# Patient Record
Sex: Female | Born: 1985
Health system: Southern US, Community
[De-identification: ages and names within clinical notes are randomized; demographics above are authoritative.]

## PROBLEM LIST (undated history)

## (undated) DIAGNOSIS — Z8719 Personal history of other diseases of the digestive system: Secondary | ICD-10-CM

## (undated) DIAGNOSIS — G43909 Migraine, unspecified, not intractable, without status migrainosus: Secondary | ICD-10-CM

## (undated) DIAGNOSIS — G5600 Carpal tunnel syndrome, unspecified upper limb: Secondary | ICD-10-CM

## (undated) DIAGNOSIS — R739 Hyperglycemia, unspecified: Secondary | ICD-10-CM

## (undated) DIAGNOSIS — Q134 Other congenital corneal malformations: Secondary | ICD-10-CM

## (undated) DIAGNOSIS — F329 Major depressive disorder, single episode, unspecified: Secondary | ICD-10-CM

## (undated) DIAGNOSIS — N926 Irregular menstruation, unspecified: Secondary | ICD-10-CM

## (undated) DIAGNOSIS — I10 Essential (primary) hypertension: Secondary | ICD-10-CM

## (undated) DIAGNOSIS — F32A Depression, unspecified: Secondary | ICD-10-CM

## (undated) DIAGNOSIS — E559 Vitamin D deficiency, unspecified: Secondary | ICD-10-CM

## (undated) DIAGNOSIS — R5382 Chronic fatigue, unspecified: Secondary | ICD-10-CM

## (undated) DIAGNOSIS — N920 Excessive and frequent menstruation with regular cycle: Secondary | ICD-10-CM

## (undated) DIAGNOSIS — G47419 Narcolepsy without cataplexy: Secondary | ICD-10-CM

## (undated) HISTORY — DX: Narcolepsy without cataplexy: G47.419

## (undated) HISTORY — DX: Personal history of other diseases of the digestive system: Z87.19

## (undated) HISTORY — DX: Vitamin D deficiency, unspecified: E55.9

## (undated) HISTORY — DX: Migraine, unspecified, not intractable, without status migrainosus: G43.909

## (undated) HISTORY — DX: Hyperglycemia, unspecified: R73.9

## (undated) HISTORY — DX: Excessive and frequent menstruation with regular cycle: N92.0

## (undated) HISTORY — DX: Depression, unspecified: F32.A

## (undated) HISTORY — DX: Irregular menstruation, unspecified: N92.6

## (undated) HISTORY — DX: Carpal tunnel syndrome, unspecified upper limb: G56.00

## (undated) HISTORY — DX: Major depressive disorder, single episode, unspecified: F32.9

## (undated) HISTORY — DX: Other congenital corneal malformations: Q13.4

## (undated) HISTORY — DX: Chronic fatigue, unspecified: R53.82

---
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
## 2010-02-14 HISTORY — PX: TUBAL LIGATION: SHX77

## 2011-02-15 DIAGNOSIS — Z8719 Personal history of other diseases of the digestive system: Secondary | ICD-10-CM

## 2011-02-15 HISTORY — DX: Personal history of other diseases of the digestive system: Z87.19

## 2011-03-29 ENCOUNTER — Emergency Department (HOSPITAL_COMMUNITY): Payer: Medicaid Other

## 2011-03-29 ENCOUNTER — Emergency Department (HOSPITAL_COMMUNITY)
Admission: EM | Admit: 2011-03-29 | Discharge: 2011-03-29 | Disposition: A | Discharge status: Home / Self Care | Payer: Medicaid Other | Attending: Emergency Medicine | Admitting: Emergency Medicine

## 2011-03-29 ENCOUNTER — Encounter (HOSPITAL_COMMUNITY): Payer: Self-pay

## 2011-03-29 DIAGNOSIS — M79671 Pain in right foot: Secondary | ICD-10-CM

## 2011-03-29 DIAGNOSIS — W208XXA Other cause of strike by thrown, projected or falling object, initial encounter: Secondary | ICD-10-CM | POA: Insufficient documentation

## 2011-03-29 DIAGNOSIS — F17200 Nicotine dependence, unspecified, uncomplicated: Secondary | ICD-10-CM | POA: Insufficient documentation

## 2011-03-29 DIAGNOSIS — M79609 Pain in unspecified limb: Secondary | ICD-10-CM | POA: Insufficient documentation

## 2011-03-29 MED ORDER — OXYCODONE-ACETAMINOPHEN 5-325 MG PO TABS: 2.0000 | ORAL_TABLET | ORAL | Status: AC | PRN

## 2011-03-29 MED ORDER — OXYCODONE-ACETAMINOPHEN 5-325 MG PO TABS
1.0000 | ORAL_TABLET | Freq: Once | ORAL | Status: AC
  Administered 2011-03-29: 1 via ORAL
  Filled 2011-03-29: qty 1

## 2011-03-29 NOTE — Progress Notes (Signed)
Orthopedic Tech Progress Note Patient Details:  Michelle Newgent 1985/12/08 [id number]  Other Ortho Devices Type of Ortho Device: Postop boot Ortho Device Location: right foot.post op boot Ortho Device Interventions: Application   Gaye Pollack 03/29/2011, 1:07 PM

## 2011-03-29 NOTE — ED Provider Notes (Signed)
History     CSN: [id number]  Arrival date & time 03/29/11  1016   First MD Initiated Contact with Patient 03/29/11 1110      Chief Complaint  Patient presents with  . Foot Injury    (Consider location/radiation/quality/duration/timing/severity/associated sxs/prior treatment) Patient is a 26 y.o. female presenting with foot injury. The history is provided by the patient.  Foot Injury    The patient is a 26 year old, female, with no significant past medical history.  She presents to the emergency department complaining of right foot pain since yesterday.  She dropped a box on it.  Yesterday.  She says the pain was so severe last night.  That she was unable to sleep.  She has no other other injuries or complaints.  She denies allergies to medications. History reviewed. No pertinent past medical history.  History reviewed. No pertinent past surgical history.  History reviewed. No pertinent family history.  History  Substance Use Topics  . Smoking status: Current Everyday Smoker  . Smokeless tobacco: Not on file  . Alcohol Use: No    OB History    Grav Para Term Preterm Abortions TAB SAB Ect Mult Living                  Review of Systems  Musculoskeletal:       Right foot pain  Neurological: Negative for weakness.  Hematological: Does not bruise/bleed easily.    Allergies  Review of patient's allergies indicates no known allergies.  Home Medications   Current Outpatient Rx  Name Route Sig Dispense Refill  . ERGOCALCIFEROL 50000 UNITS PO CAPS Oral Take 50,000 Units by mouth once a week. Takes on Fridays      BP 121/63  Pulse 78  Temp(Src) 98 F (36.7 C) (Oral)  Resp 20  SpO2 96%  LMP 02/27/2011  Physical Exam  Vitals reviewed. Constitutional: She is oriented to person, place, and time. She appears well-developed and well-nourished.  HENT:  Head: Normocephalic and atraumatic.  Eyes: Pupils are equal, round, and reactive to light.  Neck: Normal range of  motion.  Pulmonary/Chest: Effort normal. No respiratory distress.  Musculoskeletal: Normal range of motion. She exhibits tenderness. She exhibits no edema.       Right foot No deformities.  No ecchymoses.  No lacerations to moderate tenderness over the dorsum of the midfoot and forefoot. No pain at the ankle or digits.  No pain at the base of the fifth metatarsal  Neurological: She is alert and oriented to person, place, and time.  Skin: Skin is warm and dry. No erythema.  Psychiatric: She has a normal mood and affect. Her behavior is normal. Thought content normal.    ED Course  Procedures (including critical care time) Right foot pain after traumatic injury.  Yesterday.  We will perform an x-ray, and give analgesics.  Labs Reviewed - No data to display No results found.   No diagnosis found.    MDM  Right foot pain No fx or dislocation       Nicholes Stairs, MD 03/29/11 1230

## 2011-03-29 NOTE — Discharge Instructions (Signed)
Your x-ray does not show any fractures or dislocations.  Use ibuprofen 600 mg every 6 hours to reduce pain and swelling.  Apply ice to reduce pain and swelling as well.  Use the crutches until your pain resolves.  Followup with your Dr. if your symptoms.  Last more than 3-4 days.  Return for worse or uncontrolled symptoms.

## 2011-03-29 NOTE — ED Notes (Signed)
Last pm dropped box of right foot, complains of pain in same, pt sts hx of similar in past

## 2011-03-29 NOTE — ED Notes (Signed)
Discharge instructions reviewed; pt verbalizes understanding.  No questions asked; No further c/o's voiced.  Pt ambulatory to lobby using ortho shoe.  NAD noted.

## 2011-07-10 ENCOUNTER — Emergency Department (HOSPITAL_COMMUNITY)
Admission: EM | Admit: 2011-07-10 | Discharge: 2011-07-11 | Disposition: A | Discharge status: Home / Self Care | Payer: Medicaid Other | Attending: Emergency Medicine | Admitting: Emergency Medicine

## 2011-07-10 ENCOUNTER — Encounter (HOSPITAL_COMMUNITY): Payer: Self-pay | Admitting: Emergency Medicine

## 2011-07-10 DIAGNOSIS — IMO0001 Reserved for inherently not codable concepts without codable children: Secondary | ICD-10-CM | POA: Insufficient documentation

## 2011-07-10 DIAGNOSIS — H9209 Otalgia, unspecified ear: Secondary | ICD-10-CM | POA: Insufficient documentation

## 2011-07-10 DIAGNOSIS — J029 Acute pharyngitis, unspecified: Secondary | ICD-10-CM

## 2011-07-10 NOTE — ED Notes (Signed)
Pt . Reports RT. Ear pain started today. Pt was seen and treated at her PCP on Friday. Pt reports she had a neg strep and a neg mono. test. at PCP Friday.

## 2011-07-11 MED ORDER — OXYCODONE-ACETAMINOPHEN 5-325 MG PO TABS: 1.0000 | ORAL_TABLET | ORAL | Status: AC | PRN

## 2011-07-11 MED ORDER — OXYCODONE-ACETAMINOPHEN 5-325 MG PO TABS
1.0000 | ORAL_TABLET | Freq: Once | ORAL | Status: AC
  Administered 2011-07-11: 1 via ORAL
  Filled 2011-07-11 (×2): qty 1

## 2011-07-11 MED ORDER — DEXAMETHASONE 6 MG PO TABS
12.0000 mg | ORAL_TABLET | Freq: Once | ORAL | Status: AC
  Administered 2011-07-11: 12 mg via ORAL
  Filled 2011-07-11: qty 2

## 2011-07-11 NOTE — ED Notes (Signed)
Patient currently sitting up in bed; no respiratory or acute distress noted.  Patient updated on plan of care; charge RN brought to bedside to explain delay of physician exam.  Patient has no other questions or concerns at this time; will continue to monitor.

## 2011-07-11 NOTE — ED Notes (Signed)
Dr. Glick at bedside.  

## 2011-07-11 NOTE — ED Provider Notes (Signed)
History     CSN: [id number]  Arrival date & time 07/10/11  2242   First MD Initiated Contact with Patient 07/11/11 0114      Chief Complaint  Patient presents with  . Otalgia    RT    (Consider location/radiation/quality/duration/timing/severity/associated sxs/prior treatment) Patient is a 26 y.o. female presenting with ear pain. The history is provided by the patient.  Otalgia  She sore throat 4 days ago. She saw her primary care provider who did a strep screen and mono test which were negative. She was given prescriptions for Flonase and Zyrtec which have not helped. Today, she started having pain in her right ear. Pain is severe. Nothing makes it better nothing makes it worse. The pain is sharp and it feels like there is pressure in her care like she was going up a mountain in her your has not popped. She continues to have sore throat. She denies any rhinorrhea or cough and denies vomiting or diarrhea. She's had some chills but no fever or sweats. She denies nausea or vomiting she. She denies arthralgias and myalgias.  Past Medical History  Diagnosis Date  . No significant past medical history     History reviewed. No pertinent past surgical history.  History reviewed. No pertinent family history.  History  Substance Use Topics  . Smoking status: Current Everyday Smoker  . Smokeless tobacco: Not on file  . Alcohol Use: No    OB History    Grav Para Term Preterm Abortions TAB SAB Ect Mult Living                  Review of Systems  HENT: Positive for ear pain.   All other systems reviewed and are negative.    Allergies  Review of patient's allergies indicates no known allergies.  Home Medications   Current Outpatient Rx  Name Route Sig Dispense Refill  . CETIRIZINE HCL 10 MG PO TABS Oral Take 10 mg by mouth daily.    Marland Kitchen FLUTICASONE PROPIONATE 50 MCG/ACT NA SUSP Nasal Place 2 sprays into the nose 2 (two) times daily.    . TOPIRAMATE 50 MG PO TABS Oral Take 50  mg by mouth daily.      BP 124/88  Pulse 83  Temp(Src) 98.2 F (36.8 C) (Oral)  Resp 20  SpO2 100%  Physical Exam  Nursing note and vitals reviewed.  26 year old female is resting comfortably in no acute distress. Vital signs are significant for borderline tachycardia with heart rate 101. Oxygen saturation is 100% which is normal. Head is normocephalic and atraumatic. PERRLA, EOMI. Tympanic membranes are clear with a normal light reflex. Oropharynx shows mild erythema without exudate. Neck is nontender and supple without adenopathy. Back is nontender. Lungs are clear without rales, wheezes, rhonchi. Heart has regular rate and rhythm without murmur. Abdomen is soft, flat, nontender without masses or hepatosplenomegaly. Extremities have full range of motion, snow cyanosis or edema. Skin is warm and dry without rash. Neurologic: Mental status is normal, cranial nerves are intact, there are no focal motor or sensory deficits.  ED Course  Procedures (including critical care time)  Labs Reviewed - No data to display No results found.   No diagnosis found.    MDM  Otalgia which is most likely a primarily from referred pain. She may also have an early serous otitis related to her pharyngitis. She will be given a dose of dexamethasone and given Percocet for pain. She is to follow up with  her primary care physician.        Dione Booze, MD 07/11/11 [phone]

## 2011-07-11 NOTE — ED Notes (Signed)
Patient currently resting quietly in bed; no respiratory or acute distress noted.  Patient updated on plan of care; informed patient that we are currently waiting on orders from EDP.  Patient has no other questions or concerns at this time; will continue to monitor.

## 2011-07-11 NOTE — Discharge Instructions (Signed)
Otalgia The most common reason for this in children is an infection of the middle ear. Pain from the middle ear is usually caused by a build-up of fluid and pressure behind the eardrum. Pain from an earache can be sharp, dull, or burning. The pain may be temporary or constant. The middle ear is connected to the nasal passages by a short narrow tube called the Eustachian tube. The Eustachian tube allows fluid to drain out of the middle ear, and helps keep the pressure in your ear equalized. CAUSES  A cold or allergy can block the Eustachian tube with inflammation and the build-up of secretions. This is especially likely in small children, because their Eustachian tube is shorter and more horizontal. When the Eustachian tube closes, the normal flow of fluid from the middle ear is stopped. Fluid can accumulate and cause stuffiness, pain, hearing loss, and an ear infection if germs start growing in this area. SYMPTOMS  The symptoms of an ear infection may include fever, ear pain, fussiness, increased crying, and irritability. Many children will have temporary and minor hearing loss during and right after an ear infection. Permanent hearing loss is rare, but the risk increases the more infections a child has. Other causes of ear pain include retained water in the outer ear canal from swimming and bathing. Ear pain in adults is less likely to be from an ear infection. Ear pain may be referred from other locations. Referred pain may be from the joint between your jaw and the skull. It may also come from a tooth problem or problems in the neck. Other causes of ear pain include:  A foreign body in the ear.   Outer ear infection.   Sinus infections.   Impacted ear wax.   Ear injury.   Arthritis of the jaw or TMJ problems.   Middle ear infection.   Tooth infections.   Sore throat with pain to the ears.  DIAGNOSIS  Your caregiver can usually make the diagnosis by examining you. Sometimes other special  studies, including x-rays and lab work may be necessary. TREATMENT   If antibiotics were prescribed, use them as directed and finish them even if you or your child's symptoms seem to be improved.   Sometimes PE tubes are needed in children. These are little plastic tubes which are put into the eardrum during a simple surgical procedure. They allow fluid to drain easier and allow the pressure in the middle ear to equalize. This helps relieve the ear pain caused by pressure changes.  HOME CARE INSTRUCTIONS   Only take over-the-counter or prescription medicines for pain, discomfort, or fever as directed by your caregiver. DO NOT GIVE CHILDREN ASPIRIN because of the association of Reye's Syndrome in children taking aspirin.   Use a cold pack applied to the outer ear for 15 to 20 minutes, 3 to 4 times per day or as needed may reduce pain. Do not apply ice directly to the skin. You may cause frost bite.   Over-the-counter ear drops used as directed may be effective. Your caregiver may sometimes prescribe ear drops.   Resting in an upright position may help reduce pressure in the middle ear and relieve pain.   Ear pain caused by rapidly descending from high altitudes can be relieved by swallowing or chewing gum. Allowing infants to suck on a bottle during airplane travel can help.   Do not smoke in the house or near children. If you are unable to quit smoking, smoke outside.     Control allergies.  SEEK IMMEDIATE MEDICAL CARE IF:   You or your child are becoming sicker.   Pain or fever relief is not obtained with medicine.   You or your child's symptoms (pain, fever, or irritability) do not improve within 24 to 48 hours or as instructed.   Severe pain suddenly stops hurting. This may indicate a ruptured eardrum.   You or your children develop new problems such as severe headaches, stiff neck, difficulty swallowing, or swelling of the face or around the ear.  Document Released: 09/18/2003  Document Revised: 01/20/2011 Document Reviewed: 01/23/2008 [facility] Patient Information 2012 [place], [place].  Acetaminophen; Oxycodone tablets What is this medicine? ACETAMINOPHEN; OXYCODONE (a set a MEE noe fen; ox i KOE done) is a pain reliever. It is used to treat mild to moderate pain. This medicine may be used for other purposes; ask your health care provider or pharmacist if you have questions. What should I tell my health care provider before I take this medicine? They need to know if you have any of these conditions: -brain tumor -Crohn's disease, inflammatory bowel disease, or ulcerative colitis -drink more than 3 alcohol containing drinks per day -drug abuse or addiction -head injury -heart or circulation problems -kidney disease or problems going to the bathroom -liver disease -lung disease, asthma, or breathing problems -an unusual or allergic reaction to acetaminophen, oxycodone, other opioid analgesics, other medicines, foods, dyes, or preservatives -pregnant or trying to get pregnant -breast-feeding How should I use this medicine? Take this medicine by mouth with a full glass of water. Follow the directions on the prescription label. Take your medicine at regular intervals. Do not take your medicine more often than directed. Talk to your pediatrician regarding the use of this medicine in children. Special care may be needed. Patients over 50 years old may have a stronger reaction and need a smaller dose. Overdosage: If you think you have taken too much of this medicine contact a poison control center or emergency room at once. NOTE: This medicine is only for you. Do not share this medicine with others. What if I miss a dose? If you miss a dose, take it as soon as you can. If it is almost time for your next dose, take only that dose. Do not take double or extra doses. What may interact with this medicine? -alcohol or medicines that contain  alcohol -antihistamines -barbiturates like amobarbital, butalbital, butabarbital, methohexital, pentobarbital, phenobarbital, thiopental, and secobarbital -benztropine -drugs for bladder problems like solifenacin, trospium, oxybutynin, tolterodine, hyoscyamine, and methscopolamine -drugs for breathing problems like ipratropium and tiotropium -drugs for certain stomach or intestine problems like propantheline, homatropine methylbromide, glycopyrrolate, atropine, belladonna, and dicyclomine -general anesthetics like etomidate, ketamine, nitrous oxide, propofol, desflurane, enflurane, halothane, isoflurane, and sevoflurane -medicines for depression, anxiety, or psychotic disturbances -medicines for pain like codeine, morphine, pentazocine, buprenorphine, butorphanol, nalbuphine, tramadol, and propoxyphene -medicines for sleep -muscle relaxants -naltrexone -phenothiazines like perphenazine, thioridazine, chlorpromazine, mesoridazine, fluphenazine, prochlorperazine, promazine, and trifluoperazine -scopolamine -trihexyphenidyl This list may not describe all possible interactions. Give your health care provider a list of all the medicines, herbs, non-prescription drugs, or dietary supplements you use. Also tell them if you smoke, drink alcohol, or use illegal drugs. Some items may interact with your medicine. What should I watch for while using this medicine? Tell your doctor or health care professional if your pain does not go away, if it gets worse, or if you have new or a different type of pain. You may develop tolerance to  the medicine. Tolerance means that you will need a higher dose of the medication for pain relief. Tolerance is normal and is expected if you take this medicine for a long time. Do not suddenly stop taking your medicine because you may develop a severe reaction. Your body becomes used to the medicine. This does NOT mean you are addicted. Addiction is a behavior related to getting  and using a drug for a nonmedical reason. If you have pain, you have a medical reason to take pain medicine. Your doctor will tell you how much medicine to take. If your doctor wants you to stop the medicine, the dose will be slowly lowered over time to avoid any side effects. You may get drowsy or dizzy. Do not drive, use machinery, or do anything that needs mental alertness until you know how this medicine affects you. Do not stand or sit up quickly, especially if you are an older patient. This reduces the risk of dizzy or fainting spells. Alcohol may interfere with the effect of this medicine. Avoid alcoholic drinks. The medicine will cause constipation. Try to have a bowel movement at least every 2 to 3 days. If you do not have a bowel movement for 3 days, call your doctor or health care professional. Do not take Tylenol (acetaminophen) or medicines that have acetaminophen with this medicine. Too much acetaminophen can be very dangerous. Many nonprescription medicines contain acetaminophen. Always read the labels carefully to avoid taking more acetaminophen. What side effects may I notice from receiving this medicine? Side effects that you should report to your doctor or health care professional as soon as possible: -allergic reactions like skin rash, itching or hives, swelling of the face, lips, or tongue -breathing difficulties, wheezing -confusion -light headedness or fainting spells -severe stomach pain -yellowing of the skin or the whites of the eyes Side effects that usually do not require medical attention (report to your doctor or health care professional if they continue or are bothersome): -dizziness -drowsiness -nausea -vomiting This list may not describe all possible side effects. Call your doctor for medical advice about side effects. You may report side effects to FDA at 1-800-FDA-1088. Where should I keep my medicine? Keep out of the reach of children. This medicine can be  abused. Keep your medicine in a safe place to protect it from theft. Do not share this medicine with anyone. Selling or giving away this medicine is dangerous and against the law. Store at room temperature between 20 and 25 degrees C (68 and 77 degrees F). Keep container tightly closed. Protect from light. Flush any unused medicines down the toilet. Do not use the medicine after the expiration date. NOTE: This sheet is a summary. It may not cover all possible information. If you have questions about this medicine, talk to your doctor, pharmacist, or health care provider.  2012, [employer]. (12/31/2007 10:01:21 AM)

## 2011-07-11 NOTE — ED Notes (Signed)
Pharmacy called about delay in sending Decadron; pharmacy states that they are sending it STAT.

## 2011-08-06 ENCOUNTER — Encounter (HOSPITAL_COMMUNITY): Payer: Self-pay | Admitting: Emergency Medicine

## 2011-08-06 DIAGNOSIS — M65839 Other synovitis and tenosynovitis, unspecified forearm: Secondary | ICD-10-CM | POA: Insufficient documentation

## 2011-08-06 DIAGNOSIS — F17200 Nicotine dependence, unspecified, uncomplicated: Secondary | ICD-10-CM | POA: Insufficient documentation

## 2011-08-06 NOTE — ED Notes (Signed)
PT. REPORTS RIGHT HAND PAIN FOR 3 DAYS , STATES RIGHT HAND PAIN STARTED AFTER LIFTING BOXES AT HOME.

## 2011-08-07 ENCOUNTER — Emergency Department (HOSPITAL_COMMUNITY)
Admission: EM | Admit: 2011-08-07 | Discharge: 2011-08-07 | Disposition: A | Discharge status: Home / Self Care | Payer: Medicaid Other | Attending: Emergency Medicine | Admitting: Emergency Medicine

## 2011-08-07 ENCOUNTER — Emergency Department (HOSPITAL_COMMUNITY): Payer: Medicaid Other

## 2011-08-07 DIAGNOSIS — M778 Other enthesopathies, not elsewhere classified: Secondary | ICD-10-CM

## 2011-08-07 MED ORDER — HYDROCODONE-ACETAMINOPHEN 5-325 MG PO TABS
1.0000 | ORAL_TABLET | Freq: Once | ORAL | Status: AC
  Administered 2011-08-07: 1 via ORAL
  Filled 2011-08-07: qty 1

## 2011-08-07 MED ORDER — HYDROCODONE-ACETAMINOPHEN 5-325 MG PO TABS: 1.0000 | ORAL_TABLET | ORAL | Status: AC | PRN

## 2011-08-07 NOTE — ED Provider Notes (Signed)
History     CSN: [id number]  Arrival date & time 08/06/11  2306   First MD Initiated Contact with Patient 08/07/11 0124      Chief Complaint  Patient presents with  . Hand Pain   HPI  History provided by the patient. Patient is a 26 year old female with no significant past medical history who presents with complaints of right wrist and hand pain. Patient states pain has been waxing and waning for the past 3 days much more significant today. Symptoms seemed to begin after patient spent the day lifting and moving heavy boxes of her belongings. Patient did use some ibuprofen and ice over her hands which seemed to help with pain and swelling. Symptoms did return after several hours and over the last few days. Pain is worse with flexion or making a fist. She denies any other significant injury or trauma to the hand or wrist. She denies having similar symptoms previously. Symptoms were not associated with numbness or weakness.     Past Medical History  Diagnosis Date  . No significant past medical history     History reviewed. No pertinent past surgical history.  No family history on file.  History  Substance Use Topics  . Smoking status: Current Everyday Smoker  . Smokeless tobacco: Not on file  . Alcohol Use: No    OB History    Grav Para Term Preterm Abortions TAB SAB Ect Mult Living                  Review of Systems  Constitutional: Negative for fever and chills.  Skin: Negative for rash.  Neurological: Negative for weakness and numbness.    Allergies  Review of patient's allergies indicates no known allergies.  Home Medications   Current Outpatient Rx  Name Route Sig Dispense Refill  . ACETAMINOPHEN 500 MG PO TABS Oral Take 1,000 mg by mouth every 6 (six) hours as needed. For pain      BP 118/77  Pulse 88  Temp 98.5 F (36.9 C) (Oral)  Resp 18  SpO2 98%  LMP 07/16/2011  Physical Exam  Nursing note and vitals reviewed. Constitutional: She is oriented  to person, place, and time. She appears well-developed and well-nourished. No distress.  HENT:  Head: Normocephalic.  Cardiovascular: Normal rate and regular rhythm.   Pulmonary/Chest: Effort normal and breath sounds normal.  Musculoskeletal:       Full range of motion of right wrist. Pain with active flexion and full extension. Decreased pain with passive flexion of digits and wrist. Mild tenderness along the flexor surface of wrist and palm. Normal radial pulses, sensation in fingers and cap refill less than 2 seconds. No snuffbox tenderness. No gross deformities or significant swelling.  Normal left hand and extremity.  Neurological: She is alert and oriented to person, place, and time.  Skin: Skin is warm and dry. No rash noted. No erythema.  Psychiatric: She has a normal mood and affect. Her behavior is normal.    ED Course  Procedures   Dg Hand Complete Right  08/07/2011  *RADIOLOGY REPORT*  Clinical Data: Injury, hand pain.  RIGHT HAND - COMPLETE 3+ VIEW  Comparison: None.  Findings: No acute bony abnormality.  Specifically, no fracture, subluxation, or dislocation.  Soft tissues are intact.  Normal bone mineralization.  Joint spaces are maintained.  IMPRESSION: Normal study.  Original Report Authenticated By: Cyndie Chime, M.D.     1. Tendonitis of wrist, right  MDM  Patient seen and evaluated. Patient no acute distress.    Began having pain after lifting heavy boxes. Patient acting up to move her home. 3 days some intermittent pain and swelling. Did improve some with ice and rest. Pain with active flexion    Angus Seller, [place] 08/07/11 [phone]

## 2011-08-07 NOTE — Discharge Instructions (Signed)
You were seen and evaluated for your right wrist and hand pains. Your x-rays today appear normal. At this time your providers feel your symptoms are caused from tendinitis or inflammation of your tendons. It is recommended that you use rest, ice, compression and elevation for your symptoms. Please followup with your primary care provider for continued evaluation and treatment of your symptoms.   Tendinitis Tendinitis is swelling and inflammation of the tendons. Tendons are band-like tissues that connect muscle to bone. Tendinitis commonly occurs in the:   Shoulders (rotator cuff).   Heels (Achilles tendon).   Elbows (triceps tendon).  CAUSES Tendinitis is usually caused by overusing the tendon, muscles, and joints involved. When the tissue surrounding a tendon (synovium) becomes inflamed, it is called tenosynovitis. Tendinitis commonly develops in people whose jobs require repetitive motions. SYMPTOMS  Pain.   Tenderness.   Mild swelling.  DIAGNOSIS Tendinitis is usually diagnosed by physical exam. Your caregiver may also order X-rays or other imaging tests. TREATMENT Your caregiver may recommend certain medicines or exercises for your treatment. HOME CARE INSTRUCTIONS   Use a sling or splint for as long as directed by your caregiver until the pain decreases.   Put ice on the injured area.   Put ice in a plastic bag.   Place a towel between your skin and the bag.   Leave the ice on for 15 to 20 minutes, 3 to 4 times a day.   Avoid using the limb while the tendon is painful. Perform gentle range of motion exercises only as directed by your caregiver. Stop exercises if pain or discomfort increase, unless directed otherwise by your caregiver.   Only take over-the-counter or prescription medicines for pain, discomfort, or fever as directed by your caregiver.  SEEK MEDICAL CARE IF:   Your pain and swelling increase.   You develop new, unexplained symptoms, especially increased  numbness in the hands.  MAKE SURE YOU:   Understand these instructions.   Will watch your condition.   Will get help right away if you are not doing well or get worse.  Document Released: 01/29/2000 Document Revised: 01/20/2011 Document Reviewed: 04/19/2010 [facility] Patient Information 2012 [place], [place].    RESOURCE GUIDE  Chronic Pain Problems: Contact [facility]  [phone] Patients need to be referred by their primary care doctor.  Insufficient Money for Medicine: Contact United Way:  call "211" or [facility] 626-695-5841.  No Primary Care Doctor: - Call [facility] Connect  [phone] - can help you locate a primary care doctor that  accepts your insurance, provides certain services, etc. - Physician Referral [phone]  Agencies that provide inexpensive medical care: - [facility]  [phone] - [facility]  [phone] - [facility]  [phone] [facility]  [phone] - [facility]  [phone] [facility]  [phone]  Medicaid-accepting [facility] Providers: - [facility]- [address]  [phone], Mon-Fri 9am-7pm, Sat 9am-1pm - [facility]- [address] [place], [address] [place]  [phone] - [facility]- [address], [address] [place]  [phone] [facility]- [address]  [phone] - Renaye Rakers- [address] [place], [address], [phone]  Only accepts [place] patients after they have their name  applied to their card  Self Pay (no insurance) in [place]: - Sickle Cell Patients: Dr Willey Blade, [facility]  [address] [place], [phone] - [facility]- [address] [place]  [phone]       Patrcia Dolly [facility] [place]- [address]       -     [facility]- see information above (Speak to [employer]  if you do not have insurance)       -  [facility]- [address] [place], [phone]       -  [facility] [place]- [address],  [phone]       -  [facility]- [address] [phone]       -  Dr Julio Sicks-  [address], [place], [phone]       -  [facility]- [address], [phone]       -  [facility]- [address], [phone], also [address], [phone]       -    [facility]- [address] [place], [phone], 1st & 3rd Saturday   every month, 10am-1pm  1) Find a Doctor and Pay Out of Pocket Although you won't have to find out who is covered by your insurance plan, it is a good idea to ask around and get recommendations. You will then need to call the office and see if the doctor you have chosen will accept you as a new patient and what types of options they offer for patients who are self-pay. Some doctors offer discounts or will set up payment plans for their patients who do not have insurance, but you will need to ask so you aren't surprised when you get to your appointment.  2) Contact Your Local Health Department Not all health departments have doctors that can see patients for sick visits, but many do, so it is worth a call to see if yours does. If you don't know where your local health department is, you can check in your phone book. The CDC also has a tool to help you locate your state's health department, and many state websites also have listings of all of their local health departments.  3) Find a Walk-in Clinic If your illness is not likely to be very severe or complicated, you may want to try a walk in clinic. These are popping up all over the country in pharmacies, drugstores, and shopping centers. They're usually staffed by nurse practitioners or physician assistants that have been trained to treat common illnesses and complaints. They're usually fairly quick and inexpensive. However, if  you have serious medical issues or chronic medical problems, these are probably not your best option  STD Testing - [facility] Department of [facility], STD Clinic, [address], [place], phone [phone] or [phone].  Monday - Friday, call for an appointment. [facility] Department of [employer], STD Clinic, [address], [place], phone [phone] or [phone].  Monday - Friday, call for an appointment.  Abuse/Neglect: [facility] Child Abuse Hotline [phone] [facility] Child Abuse Hotline 984-598-0887 (After Hours)  Emergency Shelter:  [facility] 939-473-3880  Maternity Homes: - [facility] at [facility] [place] [facility] [phone] - [facility] [phone]  MRSA Hotline #:   (915)750-6557  [facility] Resources  [facility] [place]  United Way [facility] Dept. [address]                 [address]         [address] [place] [address]  Blondell Reveal Phone:  [phone]                                  Phone:  [phone]                   Phone:  [phone]  [facility] Mental Health, [phone] - [phone]       -     [facility] in [place], [address]                                  6611041356, [facility] Child Abuse Hotline 302-705-0236 or (619)454-4360 (After Hours)   Behavioral Health Services  Substance Abuse Resources: - [facility]  ([phone] - [facility] [phone] - [place] 3526711859 Floydene Flock [phone] - Residential & Outpatient Substance Abuse Program  [phone]  Psychological Services: [facility]  [phone] Services  6801384189 - [facility], [phone] [place]. [address] [place], ACCESS LINE: 4021497795 or [phone], [employer]  Dental Assistance  If unable to pay or uninsured, contact:  [facility] or [facility]. to become qualified for the adult dental clinic.  Patients with Medicaid: [facility] [phone] [address], [phone] [address] [phone]  If unable to pay, or uninsured, contact [facility] [phone]) or [facility] Department [phone] in [place], [phone] in [facility]) to become qualified for the adult dental clinic  Other Low-Cost Community Dental Services: - [facility]- [address] [place], [place], [place], [address], [phone], Ext. [phone], 2nd and 4th Thursday of the month at 6:30am.  10 clients each day by appointment, can sometimes see walk-in patients if someone does not show for an appointment. [facility]- [address] [place], [place], [address], [phone] - [facility]- [address] [place], [place], [address], [phone] - [facility]- [phone] [facility] Health Department- [phone] [facility] Department- [phone]

## 2011-08-07 NOTE — ED Provider Notes (Signed)
Medical screening examination/treatment/procedure(s) were performed by non-physician practitioner and as supervising physician I was immediately available for consultation/collaboration.  Deveion Denz, MD 08/07/11 0727 

## 2011-09-28 ENCOUNTER — Encounter (HOSPITAL_COMMUNITY): Payer: Self-pay | Admitting: Emergency Medicine

## 2011-09-28 DIAGNOSIS — R109 Unspecified abdominal pain: Secondary | ICD-10-CM | POA: Insufficient documentation

## 2011-09-28 DIAGNOSIS — F17200 Nicotine dependence, unspecified, uncomplicated: Secondary | ICD-10-CM | POA: Insufficient documentation

## 2011-09-28 DIAGNOSIS — Z79899 Other long term (current) drug therapy: Secondary | ICD-10-CM | POA: Insufficient documentation

## 2011-09-28 DIAGNOSIS — N72 Inflammatory disease of cervix uteri: Secondary | ICD-10-CM | POA: Insufficient documentation

## 2011-09-28 DIAGNOSIS — R112 Nausea with vomiting, unspecified: Secondary | ICD-10-CM | POA: Insufficient documentation

## 2011-09-28 LAB — CBC WITH DIFFERENTIAL/PLATELET
Basophils Absolute: 0 10*3/uL (ref 0.0–0.1)
Basophils Relative: 0 % (ref 0–1)
Eosinophils Absolute: 0.2 10*3/uL (ref 0.0–0.7)
Hemoglobin: 12.5 g/dL (ref 12.0–15.0)
MCH: 27.5 pg (ref 26.0–34.0)
MCHC: 33.8 g/dL (ref 30.0–36.0)
Monocytes Relative: 4 % (ref 3–12)
Neutro Abs: 7.4 10*3/uL (ref 1.7–7.7)
Neutrophils Relative %: 64 % (ref 43–77)
Platelets: 269 10*3/uL (ref 150–400)
RDW: 13.1 % (ref 11.5–15.5)

## 2011-09-28 LAB — URINALYSIS, ROUTINE W REFLEX MICROSCOPIC
Bilirubin Urine: NEGATIVE
Leukocytes, UA: NEGATIVE
Nitrite: NEGATIVE
Specific Gravity, Urine: 1.031 — ABNORMAL HIGH (ref 1.005–1.030)
Urobilinogen, UA: 0.2 mg/dL (ref 0.0–1.0)
pH: 5.5 (ref 5.0–8.0)

## 2011-09-28 LAB — BASIC METABOLIC PANEL
Chloride: 102 mEq/L (ref 96–112)
GFR calc Af Amer: >90 mL/min (ref >90)
GFR calc non Af Amer: >90 mL/min (ref >90)
Potassium: 3.5 mEq/L (ref 3.5–5.1)
Sodium: 139 mEq/L (ref 135–145)

## 2011-09-28 NOTE — ED Notes (Signed)
PT. REPORTS LOW ABDOMINAL PAIN WITH NAUSEA AND LEFT LEG PAIN FOR 2 DAYS , PT. STATES TAKING COLACE FOR CONSTIPATION - LAST BM YESTERDAY . DENIES FEVER OR CHILLS.

## 2011-09-29 ENCOUNTER — Emergency Department (HOSPITAL_COMMUNITY): Payer: Medicaid Other

## 2011-09-29 ENCOUNTER — Emergency Department (HOSPITAL_COMMUNITY)
Admission: EM | Admit: 2011-09-29 | Discharge: 2011-09-29 | Disposition: A | Discharge status: Home / Self Care | Payer: Medicaid Other | Attending: Emergency Medicine | Admitting: Emergency Medicine

## 2011-09-29 DIAGNOSIS — N72 Inflammatory disease of cervix uteri: Secondary | ICD-10-CM

## 2011-09-29 DIAGNOSIS — R109 Unspecified abdominal pain: Secondary | ICD-10-CM

## 2011-09-29 LAB — HEPATIC FUNCTION PANEL
ALT: 12 U/L (ref 0–35)
AST: 16 U/L (ref 0–37)
Albumin: 3.6 g/dL (ref 3.5–5.2)
Bilirubin, Direct: <0.1 mg/dL (ref 0.0–0.3)
Total Bilirubin: 0.2 mg/dL — ABNORMAL LOW (ref 0.3–1.2)

## 2011-09-29 LAB — WET PREP, GENITAL: Clue Cells Wet Prep HPF POC: NONE SEEN

## 2011-09-29 MED ORDER — CEFTRIAXONE SODIUM 250 MG IJ SOLR
250.0000 mg | Freq: Once | INTRAMUSCULAR | Status: AC
  Administered 2011-09-29: 250 mg via INTRAMUSCULAR
  Filled 2011-09-29: qty 250

## 2011-09-29 MED ORDER — IOHEXOL 300 MG/ML  SOLN
100.0000 mL | Freq: Once | INTRAMUSCULAR | Status: AC | PRN
  Administered 2011-09-29: 100 mL via INTRAVENOUS

## 2011-09-29 MED ORDER — SENNOSIDES-DOCUSATE SODIUM 8.6-50 MG PO TABS: 1.0000 | ORAL_TABLET | Freq: Every day | ORAL | Status: DC

## 2011-09-29 MED ORDER — ONDANSETRON HCL 4 MG PO TABS: 4.0000 mg | ORAL_TABLET | Freq: Four times a day (QID) | ORAL | Status: AC

## 2011-09-29 MED ORDER — DOXYCYCLINE HYCLATE 100 MG PO CAPS: 100.0000 mg | ORAL_CAPSULE | Freq: Two times a day (BID) | ORAL | Status: AC

## 2011-09-29 MED ORDER — SODIUM CHLORIDE 0.9 % IV SOLN
Freq: Once | INTRAVENOUS | Status: AC
  Administered 2011-09-29: 02:00:00 via INTRAVENOUS

## 2011-09-29 MED ORDER — IOHEXOL 300 MG/ML  SOLN
20.0000 mL | INTRAMUSCULAR | Status: AC
  Administered 2011-09-29: 20 mL via ORAL

## 2011-09-29 MED ORDER — LIDOCAINE HCL (PF) 1 % IJ SOLN
INTRAMUSCULAR | Status: AC
  Administered 2011-09-29: 1 mL
  Filled 2011-09-29: qty 5

## 2011-09-29 MED ORDER — ONDANSETRON HCL 4 MG/2ML IJ SOLN
4.0000 mg | Freq: Once | INTRAMUSCULAR | Status: AC
  Administered 2011-09-29: 4 mg via INTRAVENOUS
  Filled 2011-09-29: qty 2

## 2011-09-29 NOTE — ED Notes (Signed)
Prescriptions x3 given with discharge instructions.  

## 2011-09-29 NOTE — ED Provider Notes (Signed)
History     CSN: [id number]  Arrival date & time 09/28/11  1954   First MD Initiated Contact with Patient 09/29/11 0100      Chief Complaint  Patient presents with  . Abdominal Pain    (Consider location/radiation/quality/duration/timing/severity/associated sxs/prior treatment) HPI Comments: Patient complains of lower abdominal pain and she's had for the past several weeks. It is intermittent and associated with nausea. She's had similar pain in the past that was attributed to constipation. She denies any vomiting, fever, chills, chest pain or shortness of breath. No back pain. She feels "bubbling in her stomach" and feels that she is more distended than usual. She's had a normal appetite but decreased bowel movements which have been firm and nonbloody. Denies any urinary or vaginal symptoms.  The history is provided by the patient.    Past Medical History  Diagnosis Date  . No significant past medical history     History reviewed. No pertinent past surgical history.  No family history on file.  History  Substance Use Topics  . Smoking status: Current Everyday Smoker  . Smokeless tobacco: Not on file  . Alcohol Use: No    OB History    Grav Para Term Preterm Abortions TAB SAB Ect Mult Living                  Review of Systems  Constitutional: Negative for fever, activity change and appetite change.  HENT: Negative for neck pain.   Respiratory: Negative for cough, chest tightness and shortness of breath.   Cardiovascular: Negative for chest pain.  Gastrointestinal: Positive for nausea, vomiting and abdominal pain.  Genitourinary: Negative for dysuria, hematuria, vaginal bleeding and vaginal discharge.  Musculoskeletal: Negative for back pain.  Skin: Negative for rash.  Neurological: Negative for dizziness and headaches.    Allergies  Review of patient's allergies indicates no known allergies.  Home Medications   Current Outpatient Rx  Name Route Sig Dispense  Refill  . DOCUSATE SODIUM 100 MG PO CAPS Oral Take 100 mg by mouth 2 (two) times daily.    Marland Kitchen DOXYCYCLINE HYCLATE 100 MG PO CAPS Oral Take 1 capsule (100 mg total) by mouth 2 (two) times daily. 20 capsule 0  . ONDANSETRON HCL 4 MG PO TABS Oral Take 1 tablet (4 mg total) by mouth every 6 (six) hours. 12 tablet 0  . SENNOSIDES-DOCUSATE SODIUM 8.6-50 MG PO TABS Oral Take 1 tablet by mouth daily. 30 tablet 0    BP 100/79  Pulse 74  Temp 97.5 F (36.4 C) (Oral)  Resp 14  SpO2 98%  Physical Exam  Constitutional: She is oriented to person, place, and time. She appears well-developed and well-nourished. No distress.  HENT:  Head: Normocephalic and atraumatic.  Mouth/Throat: Oropharynx is clear and moist.  Eyes: Conjunctivae are normal. Pupils are equal, round, and reactive to light.  Neck: Normal range of motion. Neck supple.  Cardiovascular: Normal rate, regular rhythm and normal heart sounds.   No murmur heard. Pulmonary/Chest: Effort normal and breath sounds normal. No respiratory distress.  Abdominal: Soft. She exhibits distension. There is tenderness. There is no rebound and no guarding.       Mild diffuse tenderness, no guarding.  Genitourinary: Cervix exhibits no motion tenderness and no discharge. Right adnexum displays no mass and no tenderness. Left adnexum displays no mass and no tenderness. No vaginal discharge found.  Musculoskeletal: Normal range of motion. She exhibits no edema and no tenderness.  Neurological: She is alert  and oriented to person, place, and time. No cranial nerve deficit.  Skin: Skin is warm.    ED Course  Procedures (including critical care time)  Labs Reviewed  CBC WITH DIFFERENTIAL - Abnormal; Notable for the following:    WBC 11.5 (*)     All other components within normal limits  BASIC METABOLIC PANEL - Abnormal; Notable for the following:    Glucose, Bld 115 (*)     Creatinine, Ser 0.44 (*)     All other components within normal limits    URINALYSIS, ROUTINE W REFLEX MICROSCOPIC - Abnormal; Notable for the following:    Specific Gravity, Urine 1.031 (*)     All other components within normal limits  WET PREP, GENITAL - Abnormal; Notable for the following:    WBC, Wet Prep HPF POC TOO NUMEROUS TO COUNT (*)     All other components within normal limits  HEPATIC FUNCTION PANEL - Abnormal; Notable for the following:    Total Bilirubin 0.2 (*)     All other components within normal limits  POCT PREGNANCY, URINE  LIPASE, BLOOD  GC/CHLAMYDIA PROBE AMP, GENITAL   Ct Abdomen Pelvis W Contrast  09/29/2011  *RADIOLOGY REPORT*  Clinical Data: Abdominal swelling.  Left lower quadrant pain.  CT ABDOMEN AND PELVIS WITH CONTRAST  Technique:  Multidetector CT imaging of the abdomen and pelvis was performed following the standard protocol during bolus administration of intravenous contrast.  Contrast: OMNIPAQUE IOHEXOL 300 MG/ML  SOLN  Comparison: None.  Findings: Lung Bases: Mild basilar atelectasis.  Liver:  Probable fatty liver.  Spleen:  Normal.  Gallbladder:  Normal.  Common bile duct:  Normal.  Pancreas:  Normal.  Adrenal glands:  Normal.  Kidneys:  Normal enhancement.  Ureters appear within normal limits although partially obscured in the anatomic pelvis.  Stomach:  Normal.  Small bowel:  No obstruction.  Normal opacification with oral contrast.  Small bowel mesentery normal.  Colon:   No inflammatory changes of the right lower quadrant. Normal appendix.  Prominent stool burden in the ascending colon. Oral contrast just reaches the cecum.  Decompressed sigmoid.  Pelvic Genitourinary:  Physiologic appearance of the uterus and adnexa.  No free fluid.  Bones:  No aggressive osseous lesions.  Vasculature: Normal.  IMPRESSION:  1.  No acute abnormality. 2.  Probable fatty liver.  Original Report Authenticated By: Andreas Newport, M.D.     1. Abdominal pain   2. Cervicitis       MDM  Lower abdominal pain with abdominal distention and  nausea. History constipation. No urinary or vaginal symptoms. Vital stable, no distress, abdomen soft and nonsurgical.  Urinalysis negative. Pelvic exam benign.  No evidence of bowel obstruction.  Treat cervicitis and followup with PCP and GI.      Glynn Octave, MD 09/29/11 [phone]

## 2011-09-29 NOTE — ED Notes (Signed)
Pt. Reports lower left abdominal pain radiating down left leg. States she has had this before and it was bowel impaction. States she has been taking stool softener. Pt. Reports nausea and "blubbing in my stomach". Abdomin distended and tender. Bowel sounds active.

## 2011-09-30 LAB — GC/CHLAMYDIA PROBE AMP, GENITAL
Chlamydia, DNA Probe: NEGATIVE
GC Probe Amp, Genital: NEGATIVE

## 2011-12-31 ENCOUNTER — Encounter (HOSPITAL_COMMUNITY): Payer: Self-pay | Admitting: *Deleted

## 2011-12-31 ENCOUNTER — Emergency Department (HOSPITAL_COMMUNITY)
Admission: EM | Admit: 2011-12-31 | Discharge: 2011-12-31 | Disposition: A | Discharge status: Home / Self Care | Payer: Medicaid Other | Attending: Emergency Medicine | Admitting: Emergency Medicine

## 2011-12-31 DIAGNOSIS — M25549 Pain in joints of unspecified hand: Secondary | ICD-10-CM | POA: Insufficient documentation

## 2011-12-31 DIAGNOSIS — G5600 Carpal tunnel syndrome, unspecified upper limb: Secondary | ICD-10-CM | POA: Insufficient documentation

## 2011-12-31 DIAGNOSIS — G5603 Carpal tunnel syndrome, bilateral upper limbs: Secondary | ICD-10-CM

## 2011-12-31 DIAGNOSIS — F17200 Nicotine dependence, unspecified, uncomplicated: Secondary | ICD-10-CM | POA: Insufficient documentation

## 2011-12-31 MED ORDER — PREDNISONE 20 MG PO TABS
60.0000 mg | ORAL_TABLET | Freq: Once | ORAL | Status: AC
  Administered 2011-12-31: 60 mg via ORAL
  Filled 2011-12-31: qty 3

## 2011-12-31 MED ORDER — PREDNISONE 20 MG PO TABS: ORAL_TABLET | ORAL | Status: DC

## 2011-12-31 NOTE — ED Notes (Signed)
Pt c/o bilateral finger pain, reports she has hx of carpel tunnel. No obvious deformity seen.

## 2011-12-31 NOTE — ED Notes (Signed)
Paged ortho 

## 2011-12-31 NOTE — ED Provider Notes (Signed)
Medical screening examination/treatment/procedure(s) were performed by non-physician practitioner and as supervising physician I was immediately available for consultation/collaboration.   Rolan Bucco, MD 12/31/11 2325

## 2011-12-31 NOTE — ED Provider Notes (Signed)
History     CSN: [id number]  Arrival date & time 12/31/11  2139   First MD Initiated Contact with Patient 12/31/11 2238      Chief Complaint  Patient presents with  . Numbness    left finger tips  . Hand Pain    (Consider location/radiation/quality/duration/timing/severity/associated sxs/prior treatment) HPI Comments: Patient with known carpal tunnel syndrome now with worseining symptoms. Has tried rest without relief  Patient is a 26 y.o. female presenting with hand pain. The history is provided by the patient.  Hand Pain This is a recurrent problem. The current episode started more than 1 month ago. The problem occurs constantly. The problem has been gradually worsening. Associated symptoms include numbness. Pertinent negatives include no fever, joint swelling, rash or weakness.    Past Medical History  Diagnosis Date  . No significant past medical history     Past Surgical History  Procedure Date  . Tubal ligation 2012    History reviewed. No pertinent family history.  History  Substance Use Topics  . Smoking status: Current Every Day Smoker  . Smokeless tobacco: Not on file  . Alcohol Use: No    OB History    Grav Para Term Preterm Abortions TAB SAB Ect Mult Living                  Review of Systems  Constitutional: Negative for fever.  HENT: Negative.   Eyes: Negative.   Respiratory: Negative.   Cardiovascular: Negative.   Genitourinary: Negative.   Musculoskeletal: Negative for joint swelling.  Skin: Positive for color change. Negative for pallor, rash and wound.  Neurological: Positive for numbness. Negative for dizziness and weakness.    Allergies  Review of patient's allergies indicates no known allergies.  Home Medications   Current Outpatient Rx  Name  Route  Sig  Dispense  Refill  . PRESCRIPTION MEDICATION   Oral   Take 1 tablet by mouth daily. Medication-oral contraceptive (exact name unknown)         . PREDNISONE 20 MG PO TABS      3 Tabs PO Days 1-3, then 2 tabs PO Days 4-6, then 1 tab PO Day 7-9, then Half Tab PO Day 10-12   20 tablet   0     BP 145/72  Pulse 78  Temp 98.3 F (36.8 C) (Oral)  Resp 16  SpO2 97%  Physical Exam  Constitutional: She is oriented to person, place, and time. She appears well-developed and well-nourished.  HENT:  Head: Normocephalic.  Eyes: Pupils are equal, round, and reactive to light.  Neck: Normal range of motion.  Cardiovascular: Normal rate.   Musculoskeletal: Normal range of motion. She exhibits tenderness. She exhibits no edema.       Right wrist: She exhibits tenderness. She exhibits normal range of motion.       Left wrist: She exhibits tenderness. She exhibits normal range of motion.       bilateral + tinneal signs    Neurological: She is alert and oriented to person, place, and time.  Skin: Skin is warm.    ED Course  Procedures (including critical care time)  Labs Reviewed - No data to display No results found.   1. Carpal tunnel syndrome on both sides       MDM   Will place in cockup splint start steroid taper and hand referral         Arman Filter, NP 12/31/11 2313

## 2011-12-31 NOTE — ED Notes (Signed)
Pt states that she was told she had carpal tunnel. Pt states that PCP told her to come here if she started having numbness in her left fingers and now in her right fingers as well.pt states that when she sits still then the numbness and tingling go away but when she moves to open chips or anything then the pain in her wrist and fingers numb. Pt has no neuro deficits. A&O

## 2014-11-25 ENCOUNTER — Telehealth: Payer: Self-pay | Admitting: General Practice

## 2014-11-25 NOTE — Telephone Encounter (Signed)
Relation to ZO:XWRU Call back number: [phone] / [phone]  Reason for call:  Michelle Garcia  referred his wife and would like to establish care with Dr. Drue Novel (patient has [employer])

## 2014-11-25 NOTE — Telephone Encounter (Signed)
Yes, please schedule a visit at her convenience

## 2014-11-26 NOTE — Telephone Encounter (Signed)
lvm advising patient of message below °

## 2014-12-03 ENCOUNTER — Other Ambulatory Visit: Payer: Self-pay

## 2014-12-04 ENCOUNTER — Ambulatory Visit: Payer: Medicaid Other | Admitting: Internal Medicine

## 2014-12-04 ENCOUNTER — Encounter: Payer: Self-pay | Admitting: Internal Medicine

## 2014-12-04 ENCOUNTER — Ambulatory Visit (INDEPENDENT_AMBULATORY_CARE_PROVIDER_SITE_OTHER): Payer: BLUE CROSS/BLUE SHIELD | Admitting: Internal Medicine

## 2014-12-04 VITALS — BP 106/66 | HR 79 | Temp 98.4°F | Ht 62.0 in | Wt 188.4 lb

## 2014-12-04 DIAGNOSIS — J069 Acute upper respiratory infection, unspecified: Secondary | ICD-10-CM | POA: Diagnosis not present

## 2014-12-04 DIAGNOSIS — Z09 Encounter for follow-up examination after completed treatment for conditions other than malignant neoplasm: Secondary | ICD-10-CM
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
## 2014-12-04 NOTE — Progress Notes (Signed)
Pre visit review using our clinic review tool, if applicable. No additional management support is needed unless otherwise documented below in the visit note. 

## 2014-12-04 NOTE — Assessment & Plan Note (Signed)
URI: Not improving so far, on Zithromax, likely because has a viral illness. Recommend to continue with antibiotics and other measures, see AVS; work excuse for additional few days. Call if not improving. Has some chronic sx, rec to get a regular appointment to discuss them.

## 2014-12-04 NOTE — Patient Instructions (Signed)
Rest, fluids , tylenol  For cough: Take Mucinex DM twice a day as needed until better  For nasal congestion Use OTC Nasocort or Flonase : 2 nasal sprays on each side of the nose daily until you feel better  Get pseudoephedrine 30 mg (behind the counter, you need to talk with the pharmacist) take one tablet 3 or 4 times a day as needed for congestion  Take the antibiotic as prescribed  , Zithromax  Call if not gradually better over the next  10 days  Call anytime if the symptoms are severe   Schedule a routine visit at your convenience

## 2014-12-08 ENCOUNTER — Telehealth: Payer: Self-pay | Admitting: Internal Medicine

## 2014-12-09 ENCOUNTER — Telehealth: Payer: Self-pay | Admitting: Internal Medicine

## 2014-12-09 NOTE — Telephone Encounter (Signed)
Pt dropped off FMLA paperwork, states she would like to pick up and she will send it to the company herself because she has to complete her part as well. Paperwork placed in your tray at front office

## 2014-12-09 NOTE — Telephone Encounter (Signed)
error 

## 2014-12-10 NOTE — Telephone Encounter (Signed)
Spoke with pt and she stated these are the forms that her job sent her. Filled out as much as possible and forwarded to Dr. Drue NovelPaz. JG//CMA

## 2014-12-10 NOTE — Telephone Encounter (Signed)
Called and lm for pt to return call. The forms she dropped off are not FMLA, they are for a workplace accomodation/arrangement. I need to know what accomodation she is requesting. JG//CMA

## 2014-12-17 NOTE — Telephone Encounter (Signed)
Called and informed pt that forms are ready for pick up at our front desk. Copy sent for scanning. JG//CMA  

## 2014-12-23 ENCOUNTER — Ambulatory Visit (INDEPENDENT_AMBULATORY_CARE_PROVIDER_SITE_OTHER): Payer: BLUE CROSS/BLUE SHIELD | Admitting: Internal Medicine

## 2014-12-23 ENCOUNTER — Encounter: Payer: Self-pay | Admitting: Internal Medicine

## 2014-12-23 ENCOUNTER — Other Ambulatory Visit: Payer: Self-pay

## 2014-12-23 VITALS — BP 122/74 | HR 77 | Temp 97.8°F | Ht 62.0 in | Wt 189.2 lb

## 2014-12-23 DIAGNOSIS — Z23 Encounter for immunization: Secondary | ICD-10-CM

## 2014-12-23 DIAGNOSIS — R739 Hyperglycemia, unspecified: Secondary | ICD-10-CM | POA: Diagnosis not present

## 2014-12-23 DIAGNOSIS — L739 Follicular disorder, unspecified: Secondary | ICD-10-CM | POA: Diagnosis not present

## 2014-12-23 DIAGNOSIS — R1013 Epigastric pain: Secondary | ICD-10-CM

## 2014-12-23 DIAGNOSIS — Z09 Encounter for follow-up examination after completed treatment for conditions other than malignant neoplasm: Secondary | ICD-10-CM

## 2014-12-23 DIAGNOSIS — R61 Generalized hyperhidrosis: Secondary | ICD-10-CM

## 2014-12-23 MED ORDER — DICYCLOMINE HCL 10 MG PO CAPS: 10.0000 mg | ORAL_CAPSULE | Freq: Four times a day (QID) | ORAL | Status: DC | PRN

## 2014-12-23 NOTE — Patient Instructions (Addendum)
Get your blood work before you leave    apply OTC antibiotic ointment to the axilla . If the area gets swollen, red: Let me know  For stomach pain, try medication called Bentyl as needed     Next visit  for a   checkup in 4-5 weeks: (30 minutes) Please schedule an appointment at the front desk

## 2014-12-23 NOTE — Progress Notes (Signed)
Pre visit review using our clinic review tool, if applicable. No additional management support is needed unless otherwise documented below in the visit note. 
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
## 2014-12-24 LAB — CBC WITH DIFFERENTIAL/PLATELET
BASOS ABS: 0 10*3/uL (ref 0.0–0.1)
BASOS PCT: 0.3 % (ref 0.0–3.0)
EOS ABS: 0.3 10*3/uL (ref 0.0–0.7)
Eosinophils Relative: 3.2 % (ref 0.0–5.0)
HEMATOCRIT: 36 % (ref 36.0–46.0)
HEMOGLOBIN: 11.9 g/dL — AB (ref 12.0–15.0)
LYMPHS PCT: 27.7 % (ref 12.0–46.0)
Lymphs Abs: 3 10*3/uL (ref 0.7–4.0)
MCHC: 33 g/dL (ref 30.0–36.0)
MCV: 83.5 fl (ref 78.0–100.0)
MONO ABS: 0.2 10*3/uL (ref 0.1–1.0)
Monocytes Relative: 1.9 % — ABNORMAL LOW (ref 3.0–12.0)
NEUTROS ABS: 7.2 10*3/uL (ref 1.4–7.7)
Neutrophils Relative %: 66.9 % (ref 43.0–77.0)
PLATELETS: 279 10*3/uL (ref 150.0–400.0)
RBC: 4.31 Mil/uL (ref 3.87–5.11)
RDW: 13.7 % (ref 11.5–15.5)
WBC: 10.7 10*3/uL — AB (ref 4.0–10.5)

## 2014-12-24 LAB — HEMOGLOBIN A1C: Hgb A1c MFr Bld: 5.7 % (ref 4.6–6.5)

## 2014-12-24 NOTE — Assessment & Plan Note (Signed)
Chart reviewed: 2013, was referred to GI but apparently did not go 05-2014: CT of the chest negative for PE 07/2014: BMP normal, CBG 124, TSH 2.7, LFTs normal, CBC normal. CT abdomen essentially negative Chronic fatigue: Likely will improve soon as she will start Ritalin for narcolepsy   Dyspepsia: Going on for years, recent CBC showed no anemia, recent CT abdomen negative. Recommend a trial with Bentyl and check a ultrasound Hypoglycemia?: pt request to check A1c Cold sweats, on and FAO:ZHYQMVHoff:Recheck a CBC, no weight loss. Reassess in 4 weeks Skin infection: apparently the patient unroof a axilary pustule while shaving. Rec topical antibiotics and pressure to stop the bleeding. RTC 4-5 weeks

## 2014-12-29 ENCOUNTER — Encounter: Payer: BLUE CROSS/BLUE SHIELD | Admitting: Family Medicine

## 2015-01-01 ENCOUNTER — Ambulatory Visit (HOSPITAL_BASED_OUTPATIENT_CLINIC_OR_DEPARTMENT_OTHER)
Admission: RE | Admit: 2015-01-01 | Discharge: 2015-01-01 | Disposition: A | Discharge status: Home / Self Care | Payer: BLUE CROSS/BLUE SHIELD | Source: Ambulatory Visit | Attending: Internal Medicine | Admitting: Internal Medicine

## 2015-01-01 DIAGNOSIS — R1013 Epigastric pain: Secondary | ICD-10-CM | POA: Insufficient documentation

## 2015-01-01 DIAGNOSIS — R110 Nausea: Secondary | ICD-10-CM | POA: Diagnosis not present

## 2015-01-27 ENCOUNTER — Ambulatory Visit: Payer: BLUE CROSS/BLUE SHIELD | Admitting: Internal Medicine

## 2015-01-27 ENCOUNTER — Telehealth: Payer: Self-pay | Admitting: Internal Medicine

## 2015-01-27 DIAGNOSIS — Z0289 Encounter for other administrative examinations: Secondary | ICD-10-CM

## 2015-01-28 NOTE — Telephone Encounter (Signed)
No , is okay 

## 2015-01-28 NOTE — Telephone Encounter (Signed)
Date/Time of No Show: 01/27/15 1:00pm Type of Appt: 30 min OV Rescheduled: yes 02/04/15 thru mychart (I changed to 30  Min) # NS in last year:  1  Charge or No Charge?

## 2015-02-04 ENCOUNTER — Ambulatory Visit: Payer: BLUE CROSS/BLUE SHIELD | Admitting: Internal Medicine

## 2015-02-05 ENCOUNTER — Telehealth: Payer: Self-pay | Admitting: Internal Medicine

## 2015-02-05 NOTE — Telephone Encounter (Signed)
Pt was no show 02/04/15 2:15pm, mychart appt, 2nd no show since new pt appt 12/04/14, charge or no charge?

## 2015-02-06 NOTE — Telephone Encounter (Signed)
Charge. 

## 2015-03-06 ENCOUNTER — Ambulatory Visit: Payer: Medicaid Other | Admitting: Internal Medicine

## 2015-03-10 ENCOUNTER — Ambulatory Visit (INDEPENDENT_AMBULATORY_CARE_PROVIDER_SITE_OTHER): Payer: BLUE CROSS/BLUE SHIELD | Admitting: Internal Medicine

## 2015-03-10 ENCOUNTER — Encounter: Payer: Self-pay | Admitting: Internal Medicine

## 2015-03-10 VITALS — BP 118/74 | HR 72 | Temp 97.7°F | Ht 62.0 in | Wt 191.4 lb

## 2015-03-10 DIAGNOSIS — L739 Follicular disorder, unspecified: Secondary | ICD-10-CM | POA: Diagnosis not present

## 2015-03-10 DIAGNOSIS — G5603 Carpal tunnel syndrome, bilateral upper limbs: Secondary | ICD-10-CM | POA: Diagnosis not present

## 2015-03-10 DIAGNOSIS — R1013 Epigastric pain: Secondary | ICD-10-CM | POA: Diagnosis not present

## 2015-03-10 MED ORDER — NYSTATIN-TRIAMCINOLONE 100000-0.1 UNIT/GM-% EX OINT: 1.0000 "application " | TOPICAL_OINTMENT | Freq: Two times a day (BID) | CUTANEOUS | Status: DC

## 2015-03-10 MED ORDER — CEPHALEXIN 500 MG PO CAPS: 500.0000 mg | ORAL_CAPSULE | Freq: Four times a day (QID) | ORAL | Status: DC

## 2015-03-10 NOTE — Patient Instructions (Addendum)
BEFORE YOU LEAVE THE OFFICE: GO TO THE FRONT DESK Schedule a routine office visit or check up to be done in  6-8 months     AFTER YOU LEAVE THE OFFICE:  Apply the cream to the right arm pit twice a day. Call if no better.
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
## 2015-03-10 NOTE — Progress Notes (Signed)
Pre visit review using our clinic review tool, if applicable. No additional management support is needed unless otherwise documented below in the visit note. 

## 2015-10-08 ENCOUNTER — Telehealth: Payer: Self-pay

## 2015-10-08 ENCOUNTER — Ambulatory Visit: Payer: BLUE CROSS/BLUE SHIELD | Admitting: Internal Medicine

## 2015-10-08 DIAGNOSIS — Z0289 Encounter for other administrative examinations: Secondary | ICD-10-CM

## 2015-10-08 NOTE — Telephone Encounter (Signed)
Charge for no show? °

## 2015-10-08 NOTE — Telephone Encounter (Signed)
Pt's third no show today in last year.   01/27/2015 4-5 week follow-up (No show) 02/04/2015 4-5 week follow-up (No show) 10/08/2015 routine follow-up (No show)   Please advise.

## 2015-10-09 ENCOUNTER — Encounter: Payer: Self-pay | Admitting: Internal Medicine

## 2016-07-08 IMAGING — US US ABDOMEN COMPLETE
1 series · 14 of 25 positions shown · non-contrast
Comparison: CT abdomen pelvis of 09/29/2011

CLINICAL DATA: Postprandial epigastric pain, some nausea

EXAM:
ULTRASOUND ABDOMEN COMPLETE

[Series 1: us abdomen complete · 0.18mm/px · 14 of 71 slices shown]
[im 1/71]
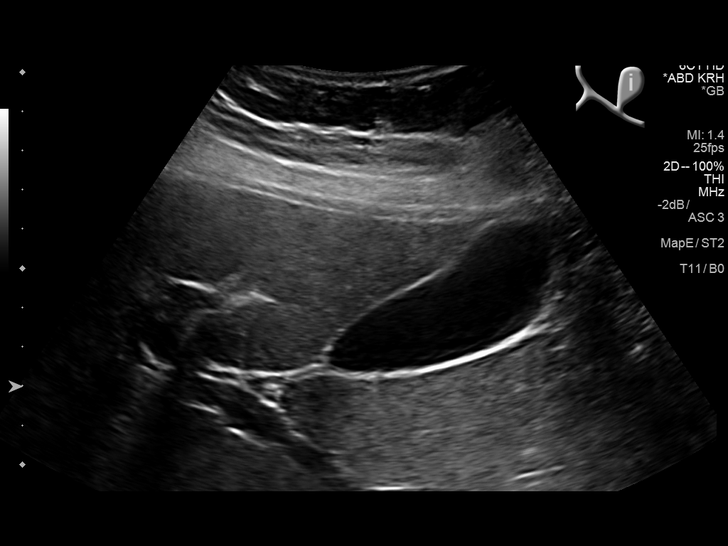
[im 6/71]
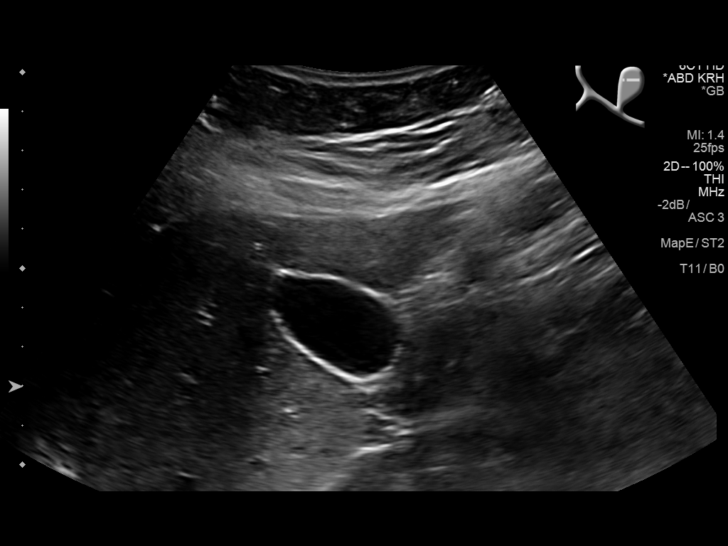
[im 12/71]
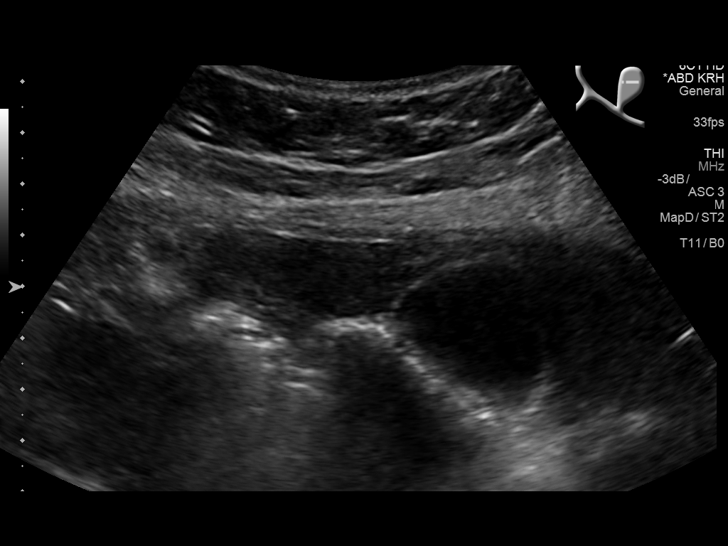
[im 18/71]
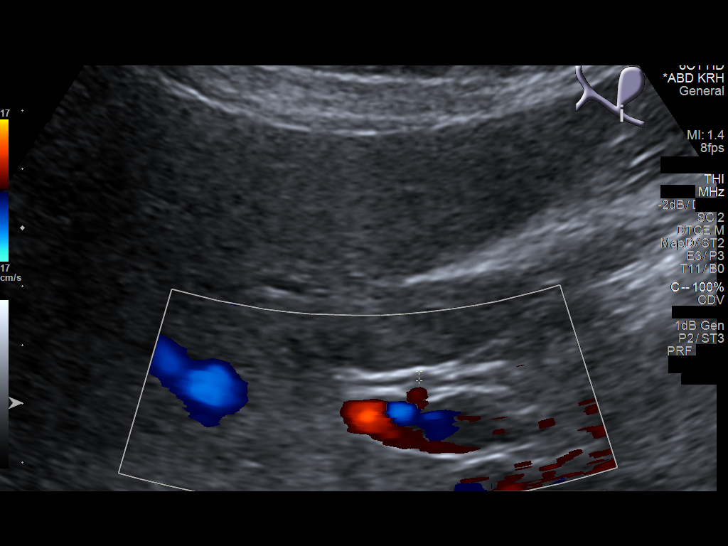
[im 24/71]
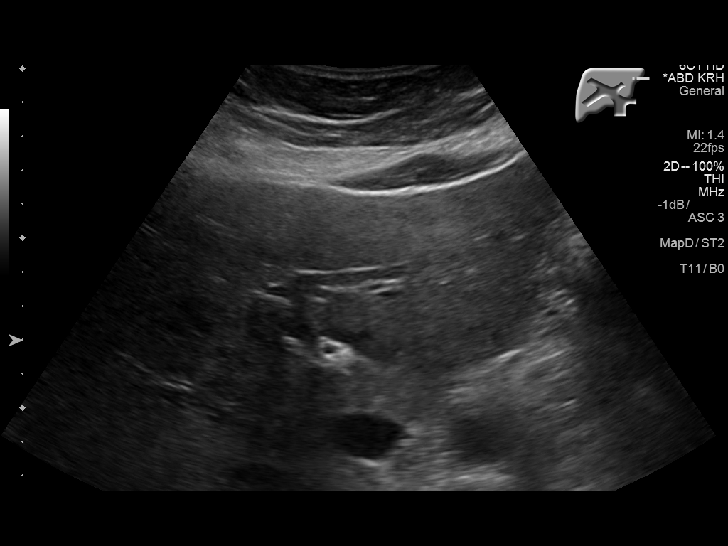
[im 27/71]
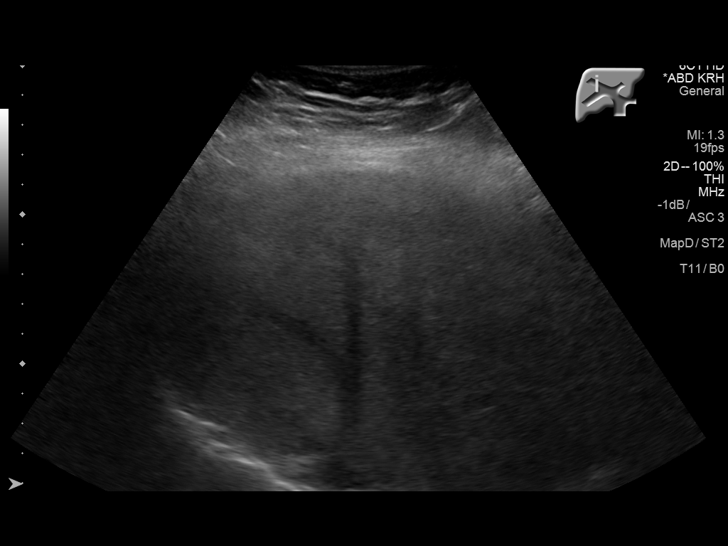
[im 33/71]
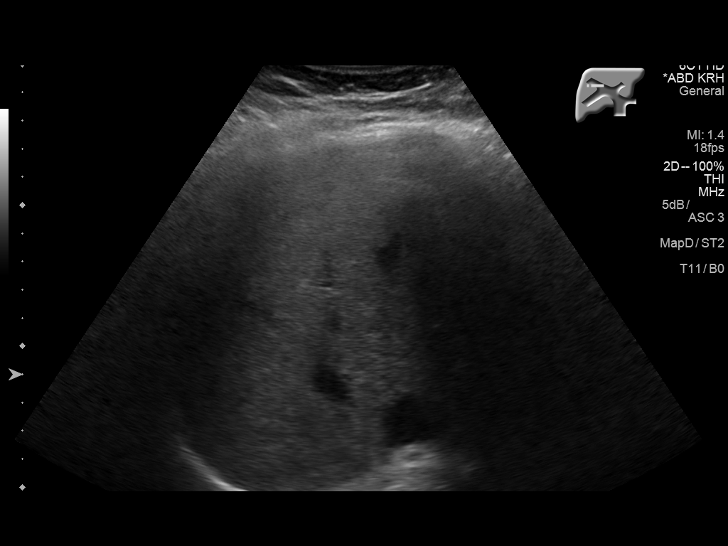
[im 38/71]
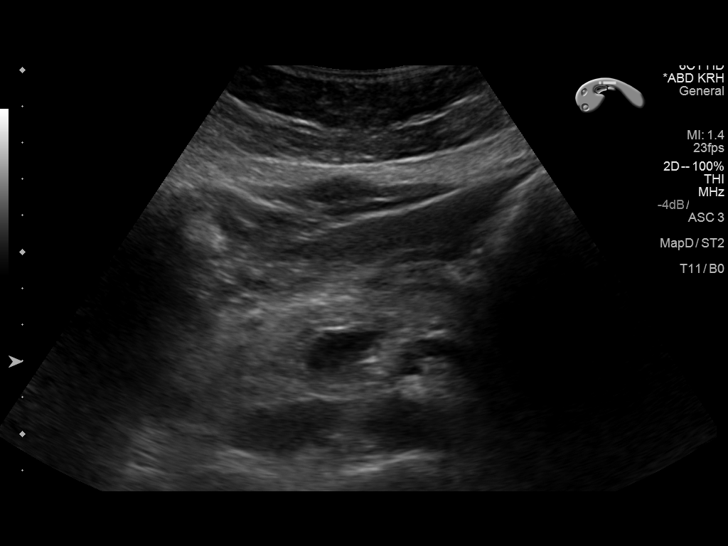
[im 44/71]
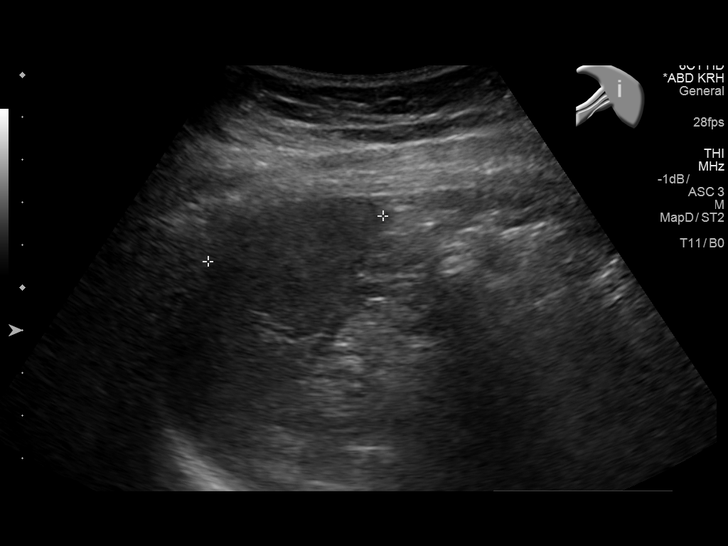
[im 47/71]
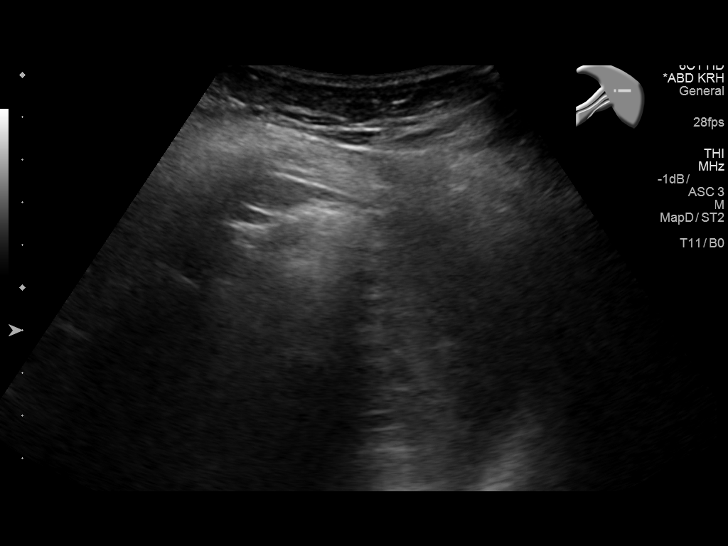
[im 53/71]
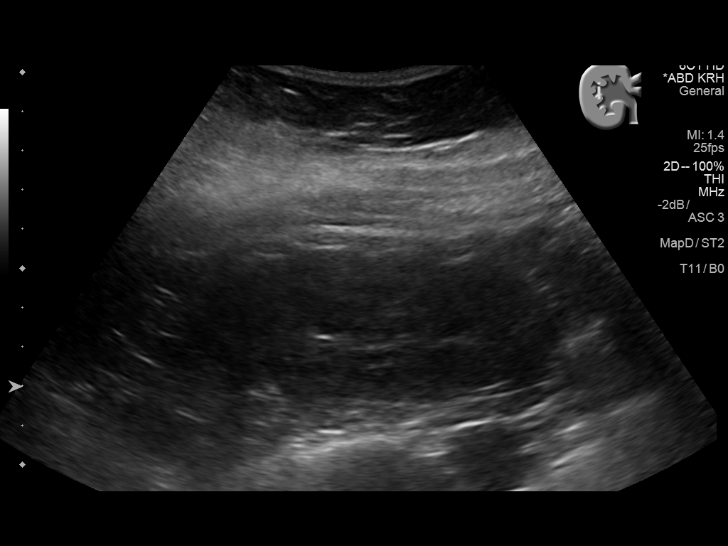
[im 59/71]
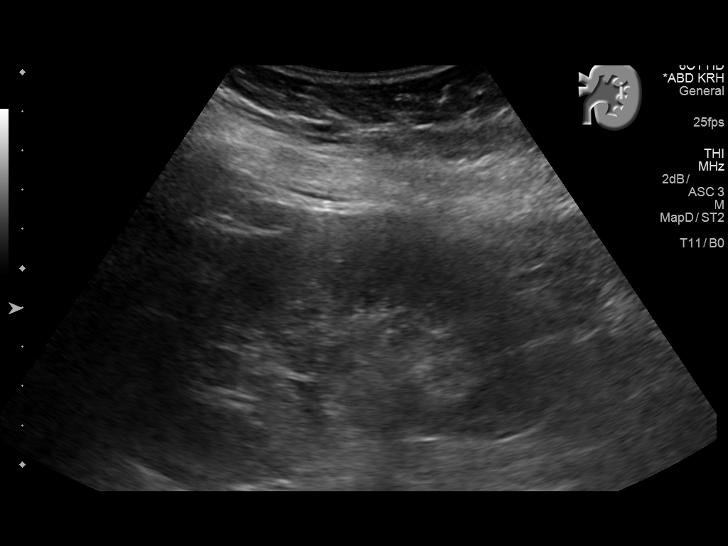
[im 65/71]
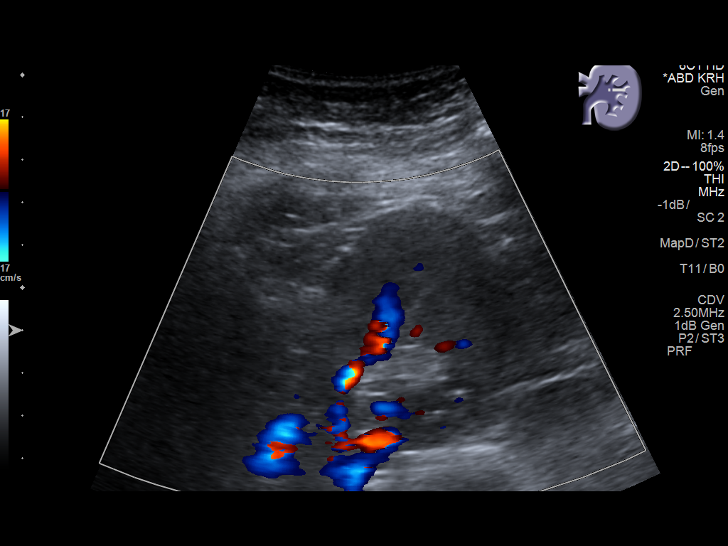
[im 71/71]
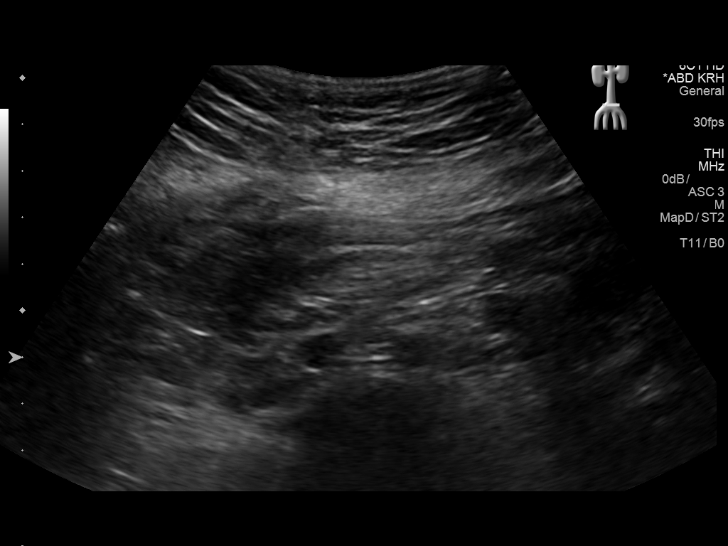

[14 of 25 positions shown; findings below may reference images not displayed]

FINDINGS: Gallbladder: The gallbladder is visualized and no gallstones are
noted. There is no pain over the gallbladder with compression.

Common bile duct: Diameter: The common bile duct is normal measuring
2 mm in diameter.

Liver: The liver is diffusely echogenic consistent with fatty
infiltration. No focal hepatic abnormality is seen.

IVC: The IVC is obscured by bowel gas.

Pancreas: Much of the pancreas also is obscured by bowel gas and
cannot be evaluated.

Spleen: The spleen is normal measuring 4.2 cm.

Right Kidney: Length: 11.4 cm..  No hydronephrosis is seen.

Left Kidney: Length: 11.6 cm..  No hydronephrosis is noted.

Abdominal aorta: Portions of the abdominal aorta also are obscured
by bowel gas.

Other findings: None.
IMPRESSION: 1. No gallstones.  No ductal dilatation.
2. Echogenic liver parenchyma consistent with fatty infiltration.
3. Bowel gas obscures much of the pancreas and the abdominal aorta.

## 2020-11-21 ENCOUNTER — Emergency Department (HOSPITAL_COMMUNITY): Payer: No Typology Code available for payment source

## 2020-11-21 ENCOUNTER — Emergency Department (HOSPITAL_COMMUNITY)
Admission: EM | Admit: 2020-11-21 | Discharge: 2020-11-21 | Disposition: A | Discharge status: Home / Self Care | Payer: No Typology Code available for payment source | Attending: Emergency Medicine | Admitting: Emergency Medicine

## 2020-11-21 ENCOUNTER — Other Ambulatory Visit: Payer: Self-pay

## 2020-11-21 DIAGNOSIS — Z87891 Personal history of nicotine dependence: Secondary | ICD-10-CM | POA: Insufficient documentation

## 2020-11-21 DIAGNOSIS — S99911A Unspecified injury of right ankle, initial encounter: Secondary | ICD-10-CM | POA: Diagnosis present

## 2020-11-21 DIAGNOSIS — W010XXA Fall on same level from slipping, tripping and stumbling without subsequent striking against object, initial encounter: Secondary | ICD-10-CM | POA: Diagnosis not present

## 2020-11-21 DIAGNOSIS — Y92009 Unspecified place in unspecified non-institutional (private) residence as the place of occurrence of the external cause: Secondary | ICD-10-CM | POA: Diagnosis not present

## 2020-11-21 DIAGNOSIS — Y93K1 Activity, walking an animal: Secondary | ICD-10-CM | POA: Insufficient documentation

## 2020-11-21 DIAGNOSIS — S8264XA Nondisplaced fracture of lateral malleolus of right fibula, initial encounter for closed fracture: Secondary | ICD-10-CM | POA: Insufficient documentation

## 2020-11-21 DIAGNOSIS — M25579 Pain in unspecified ankle and joints of unspecified foot: Secondary | ICD-10-CM

## 2020-11-21 DIAGNOSIS — M25571 Pain in right ankle and joints of right foot: Secondary | ICD-10-CM | POA: Diagnosis not present

## 2020-11-21 MED ORDER — OXYCODONE-ACETAMINOPHEN 5-325 MG PO TABS
2.0000 | ORAL_TABLET | Freq: Once | ORAL | Status: AC
  Administered 2020-11-21: 2 via ORAL
  Filled 2020-11-21: qty 2

## 2020-11-21 MED ORDER — HYDROCODONE-ACETAMINOPHEN 5-325 MG PO TABS: 1.0000 | ORAL_TABLET | Freq: Four times a day (QID) | ORAL | 0 refills | Status: DC | PRN

## 2020-11-21 MED ORDER — IBUPROFEN 800 MG PO TABS
800.0000 mg | ORAL_TABLET | Freq: Once | ORAL | Status: AC
  Administered 2020-11-21: 800 mg via ORAL
  Filled 2020-11-21: qty 1

## 2020-11-21 NOTE — ED Triage Notes (Signed)
Pt BIB EMS from home c/o right ankle pain after mechanical fall while walking her dogs. Pt heard a "crack" Denies neck/back pain.

## 2020-11-21 NOTE — ED Notes (Addendum)
[facility] ortho tech called to place a splint.

## 2020-11-21 NOTE — Progress Notes (Signed)
Orthopedic Tech Progress Note Patient Details:  Michelle Garcia 03/28/1985 [id number]  Ortho Devices Type of Ortho Device: Post (short leg) splint, Stirrup splint Ortho Device/Splint Location: rle Ortho Device/Splint Interventions: Ordered, Application, Adjustment   Post Interventions Patient Tolerated: Well Instructions Provided: Care of device, Adjustment of device  Trinna Post 11/21/2020, 11:44 PM
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
## 2020-11-23 ENCOUNTER — Other Ambulatory Visit: Payer: Self-pay | Admitting: Orthopedic Surgery

## 2020-11-23 ENCOUNTER — Encounter (HOSPITAL_BASED_OUTPATIENT_CLINIC_OR_DEPARTMENT_OTHER): Payer: Self-pay | Admitting: Orthopaedic Surgery

## 2020-11-23 ENCOUNTER — Other Ambulatory Visit: Payer: Self-pay

## 2020-11-23 DIAGNOSIS — S82891A Other fracture of right lower leg, initial encounter for closed fracture: Secondary | ICD-10-CM

## 2020-11-24 ENCOUNTER — Encounter (HOSPITAL_BASED_OUTPATIENT_CLINIC_OR_DEPARTMENT_OTHER): Payer: Self-pay | Admitting: Orthopaedic Surgery

## 2020-11-26 ENCOUNTER — Ambulatory Visit
Admission: RE | Admit: 2020-11-26 | Discharge: 2020-11-26 | Disposition: A | Discharge status: Home / Self Care | Payer: Self-pay | Source: Ambulatory Visit | Attending: Orthopedic Surgery | Admitting: Orthopedic Surgery

## 2020-11-26 ENCOUNTER — Encounter (HOSPITAL_BASED_OUTPATIENT_CLINIC_OR_DEPARTMENT_OTHER)
Admission: RE | Admit: 2020-11-26 | Discharge: 2020-11-26 | Disposition: A | Discharge status: Home / Self Care | Payer: No Typology Code available for payment source | Source: Ambulatory Visit | Attending: Orthopaedic Surgery | Admitting: Orthopaedic Surgery

## 2020-11-26 DIAGNOSIS — Z01818 Encounter for other preprocedural examination: Secondary | ICD-10-CM | POA: Diagnosis not present

## 2020-11-26 DIAGNOSIS — S82891A Other fracture of right lower leg, initial encounter for closed fracture: Secondary | ICD-10-CM

## 2020-11-26 NOTE — Progress Notes (Signed)
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
## 2020-12-01 ENCOUNTER — Ambulatory Visit (HOSPITAL_BASED_OUTPATIENT_CLINIC_OR_DEPARTMENT_OTHER): Payer: No Typology Code available for payment source | Admitting: Anesthesiology

## 2020-12-01 ENCOUNTER — Encounter (HOSPITAL_BASED_OUTPATIENT_CLINIC_OR_DEPARTMENT_OTHER)
Admission: RE | Disposition: A | Discharge status: Home / Self Care | Payer: Self-pay | Source: Home / Self Care | Attending: Orthopedic Surgery

## 2020-12-01 ENCOUNTER — Ambulatory Visit (HOSPITAL_BASED_OUTPATIENT_CLINIC_OR_DEPARTMENT_OTHER): Payer: No Typology Code available for payment source

## 2020-12-01 ENCOUNTER — Encounter (HOSPITAL_BASED_OUTPATIENT_CLINIC_OR_DEPARTMENT_OTHER): Payer: Self-pay | Admitting: Orthopedic Surgery

## 2020-12-01 ENCOUNTER — Other Ambulatory Visit: Payer: Self-pay

## 2020-12-01 ENCOUNTER — Ambulatory Visit (HOSPITAL_BASED_OUTPATIENT_CLINIC_OR_DEPARTMENT_OTHER)
Admission: RE | Admit: 2020-12-01 | Discharge: 2020-12-01 | Disposition: A | Discharge status: Home / Self Care | Payer: No Typology Code available for payment source | Attending: Orthopedic Surgery | Admitting: Orthopedic Surgery

## 2020-12-01 DIAGNOSIS — Z79899 Other long term (current) drug therapy: Secondary | ICD-10-CM | POA: Diagnosis not present

## 2020-12-01 DIAGNOSIS — X501XXA Overexertion from prolonged static or awkward postures, initial encounter: Secondary | ICD-10-CM | POA: Diagnosis not present

## 2020-12-01 DIAGNOSIS — Z87891 Personal history of nicotine dependence: Secondary | ICD-10-CM | POA: Insufficient documentation

## 2020-12-01 DIAGNOSIS — Y93K1 Activity, walking an animal: Secondary | ICD-10-CM | POA: Diagnosis not present

## 2020-12-01 DIAGNOSIS — S82841A Displaced bimalleolar fracture of right lower leg, initial encounter for closed fracture: Secondary | ICD-10-CM | POA: Insufficient documentation

## 2020-12-01 HISTORY — PX: ORIF ANKLE FRACTURE: SHX5408

## 2020-12-01 HISTORY — DX: Essential (primary) hypertension: I10

## 2020-12-01 LAB — POCT PREGNANCY, URINE: Preg Test, Ur: NEGATIVE

## 2020-12-01 SURGERY — OPEN REDUCTION INTERNAL FIXATION (ORIF) ANKLE FRACTURE
Anesthesia: General | Site: Ankle | Laterality: Right

## 2020-12-01 MED ORDER — FENTANYL CITRATE (PF) 100 MCG/2ML IJ SOLN
INTRAMUSCULAR | Status: AC
  Filled 2020-12-01: qty 2

## 2020-12-01 MED ORDER — DEXAMETHASONE SODIUM PHOSPHATE 10 MG/ML IJ SOLN
INTRAMUSCULAR | Status: DC | PRN
  Administered 2020-12-01: 10 mg via INTRAVENOUS

## 2020-12-01 MED ORDER — OXYCODONE HCL 5 MG PO TABS
5.0000 mg | ORAL_TABLET | Freq: Once | ORAL | Status: DC | PRN
Start: 2020-12-01 — End: 2020-12-01

## 2020-12-01 MED ORDER — LIDOCAINE 2% (20 MG/ML) 5 ML SYRINGE
INTRAMUSCULAR | Status: DC | PRN
Start: 2020-12-01 — End: 2020-12-01
  Administered 2020-12-01: 40 mg via INTRAVENOUS

## 2020-12-01 MED ORDER — LACTATED RINGERS IV SOLN: INTRAVENOUS | Status: DC

## 2020-12-01 MED ORDER — CEFAZOLIN SODIUM-DEXTROSE 2-4 GM/100ML-% IV SOLN
INTRAVENOUS | Status: AC
  Filled 2020-12-01: qty 100

## 2020-12-01 MED ORDER — HYDROMORPHONE HCL 1 MG/ML IJ SOLN: 0.2500 mg | INTRAMUSCULAR | Status: DC | PRN

## 2020-12-01 MED ORDER — MEPERIDINE HCL 25 MG/ML IJ SOLN: 6.2500 mg | INTRAMUSCULAR | Status: DC | PRN

## 2020-12-01 MED ORDER — EPHEDRINE 5 MG/ML INJ
INTRAVENOUS | Status: AC
  Filled 2020-12-01: qty 5

## 2020-12-01 MED ORDER — PROPOFOL 10 MG/ML IV BOLUS
INTRAVENOUS | Status: DC | PRN
  Administered 2020-12-01: 200 mg via INTRAVENOUS

## 2020-12-01 MED ORDER — AMISULPRIDE (ANTIEMETIC) 5 MG/2ML IV SOLN: 10.0000 mg | Freq: Once | INTRAVENOUS | Status: DC | PRN

## 2020-12-01 MED ORDER — MIDAZOLAM HCL 2 MG/2ML IJ SOLN
INTRAMUSCULAR | Status: AC
  Filled 2020-12-01: qty 2

## 2020-12-01 MED ORDER — MIDAZOLAM HCL 2 MG/2ML IJ SOLN
2.0000 mg | Freq: Once | INTRAMUSCULAR | Status: AC
  Administered 2020-12-01: 2 mg via INTRAVENOUS

## 2020-12-01 MED ORDER — FENTANYL CITRATE (PF) 100 MCG/2ML IJ SOLN
100.0000 ug | Freq: Once | INTRAMUSCULAR | Status: AC
  Administered 2020-12-01: 100 ug via INTRAVENOUS

## 2020-12-01 MED ORDER — PROMETHAZINE HCL 25 MG/ML IJ SOLN: 6.2500 mg | INTRAMUSCULAR | Status: DC | PRN

## 2020-12-01 MED ORDER — ONDANSETRON HCL 4 MG/2ML IJ SOLN
INTRAMUSCULAR | Status: DC | PRN
  Administered 2020-12-01: 4 mg via INTRAVENOUS

## 2020-12-01 MED ORDER — OXYCODONE HCL 5 MG/5ML PO SOLN: 5.0000 mg | Freq: Once | ORAL | Status: DC | PRN

## 2020-12-01 MED ORDER — FENTANYL CITRATE (PF) 100 MCG/2ML IJ SOLN
INTRAMUSCULAR | Status: DC | PRN
  Administered 2020-12-01: 50 ug via INTRAVENOUS

## 2020-12-01 MED ORDER — CEFAZOLIN SODIUM-DEXTROSE 2-4 GM/100ML-% IV SOLN
2.0000 g | INTRAVENOUS | Status: AC
  Administered 2020-12-01: 2 g via INTRAVENOUS

## 2020-12-01 MED ORDER — ROPIVACAINE HCL 5 MG/ML IJ SOLN
INTRAMUSCULAR | Status: DC | PRN
  Administered 2020-12-01: 50 mL via PERINEURAL

## 2020-12-01 MED ORDER — EPHEDRINE SULFATE-NACL 50-0.9 MG/10ML-% IV SOSY
PREFILLED_SYRINGE | INTRAVENOUS | Status: DC | PRN
  Administered 2020-12-01 (×3): 10 mg via INTRAVENOUS

## 2020-12-01 SURGICAL SUPPLY — 83 items
BANDAGE ESMARK 6X9 LF (GAUZE/BANDAGES/DRESSINGS) ×1 IMPLANT
BIT DRILL 110X2.5XQCK CNCT (BIT) ×1 IMPLANT
BIT DRILL 2.5 (BIT) ×1
BIT DRILL 2.7XCANN QCK CNCT (BIT) ×1 IMPLANT
BIT DRILL CANN 2.7 (BIT) ×1
BIT DRL 110X2.5XQCK CNCT (BIT) ×1
BIT DRL 2.7XCANN QCK CNCT (BIT) ×1
BLADE SURG 15 STRL LF DISP TIS (BLADE) ×3 IMPLANT
BLADE SURG 15 STRL SS (BLADE) ×3
BNDG COHESIVE 4X5 TAN ST LF (GAUZE/BANDAGES/DRESSINGS) ×2 IMPLANT
BNDG ELASTIC 4X5.8 VLCR STR LF (GAUZE/BANDAGES/DRESSINGS) ×2 IMPLANT
BNDG ELASTIC 6X5.8 VLCR STR LF (GAUZE/BANDAGES/DRESSINGS) ×2 IMPLANT
BNDG ESMARK 4X9 LF (GAUZE/BANDAGES/DRESSINGS) ×2 IMPLANT
BNDG ESMARK 6X9 LF (GAUZE/BANDAGES/DRESSINGS) ×2
CANISTER SUCT 1200ML W/VALVE (MISCELLANEOUS) ×2 IMPLANT
CLSR STERI-STRIP ANTIMIC 1/2X4 (GAUZE/BANDAGES/DRESSINGS) IMPLANT
COVER BACK TABLE 60X90IN (DRAPES) ×2 IMPLANT
CUFF TOURN SGL QUICK 34 (TOURNIQUET CUFF)
CUFF TRNQT CYL 34X4.125X (TOURNIQUET CUFF) IMPLANT
DECANTER SPIKE VIAL GLASS SM (MISCELLANEOUS) IMPLANT
DRAPE C-ARM 42X72 X-RAY (DRAPES) IMPLANT
DRAPE C-ARMOR (DRAPES) IMPLANT
DRAPE EXTREMITY T 121X128X90 (DISPOSABLE) ×2 IMPLANT
DRAPE IMP U-DRAPE 54X76 (DRAPES) ×2 IMPLANT
DRAPE INCISE IOBAN 66X45 STRL (DRAPES) ×2 IMPLANT
DRAPE OEC MINIVIEW 54X84 (DRAPES) IMPLANT
DRAPE U-SHAPE 47X51 STRL (DRAPES) ×2 IMPLANT
DRSG ADAPTIC 3X8 NADH LF (GAUZE/BANDAGES/DRESSINGS) IMPLANT
DRSG PAD ABDOMINAL 8X10 ST (GAUZE/BANDAGES/DRESSINGS) ×4 IMPLANT
DURAPREP 26ML APPLICATOR (WOUND CARE) ×2 IMPLANT
ELECT REM PT RETURN 9FT ADLT (ELECTROSURGICAL) ×2
ELECTRODE REM PT RTRN 9FT ADLT (ELECTROSURGICAL) ×1 IMPLANT
GAUZE SPONGE 4X4 12PLY STRL (GAUZE/BANDAGES/DRESSINGS) ×2 IMPLANT
GLOVE SRG 8 PF TXTR STRL LF DI (GLOVE) ×1 IMPLANT
GLOVE SURG ENC MOIS LTX SZ7 (GLOVE) ×4 IMPLANT
GLOVE SURG ORTHO LTX SZ7.5 (GLOVE) ×2 IMPLANT
GLOVE SURG UNDER POLY LF SZ7 (GLOVE) ×8 IMPLANT
GLOVE SURG UNDER POLY LF SZ8 (GLOVE) ×1
GOWN STRL REUS W/ TWL LRG LVL3 (GOWN DISPOSABLE) ×2 IMPLANT
GOWN STRL REUS W/ TWL XL LVL3 (GOWN DISPOSABLE) ×2 IMPLANT
GOWN STRL REUS W/TWL LRG LVL3 (GOWN DISPOSABLE) ×2
GOWN STRL REUS W/TWL XL LVL3 (GOWN DISPOSABLE) ×2
K-WIRE ACE 1.6X6 (WIRE) ×4
KWIRE ACE 1.6X6 (WIRE) ×2 IMPLANT
NEEDLE HYPO 25X1 1.5 SAFETY (NEEDLE) IMPLANT
NS IRRIG 1000ML POUR BTL (IV SOLUTION) ×2 IMPLANT
PACK BASIN DAY SURGERY FS (CUSTOM PROCEDURE TRAY) ×2 IMPLANT
PAD CAST 4YDX4 CTTN HI CHSV (CAST SUPPLIES) ×2 IMPLANT
PADDING CAST COTTON 4X4 STRL (CAST SUPPLIES) ×2
PENCIL SMOKE EVACUATOR (MISCELLANEOUS) ×2 IMPLANT
PLATE 5HOLE 1/3 TUBULAR (Plate) ×2 IMPLANT
SCREW CANC 2.5XFT 12X4XST SM (Screw) ×2 IMPLANT
SCREW CANC 4.0X12 (Screw) ×2 IMPLANT
SCREW CANCELLOUS FT 4.0X14 (Screw) ×4 IMPLANT
SCREW CANN 1/3 THRD RVRS CT (Screw) ×1 IMPLANT
SCREW CANNULATED 4.0X40 (Screw) ×1 IMPLANT
SCREW CORTICAL 3.5 16MM (Screw) ×2 IMPLANT
SCREW CORTICAL 3.5X12 (Screw) ×4 IMPLANT
SHEET MEDIUM DRAPE 40X70 STRL (DRAPES) IMPLANT
SLEEVE SCD COMPRESS KNEE MED (STOCKING) ×2 IMPLANT
SPLINT FAST PLASTER 5X30 (CAST SUPPLIES)
SPLINT PLASTER CAST FAST 5X30 (CAST SUPPLIES) IMPLANT
SPONGE T-LAP 4X18 ~~LOC~~+RFID (SPONGE) ×2 IMPLANT
STAPLER VISISTAT 35W (STAPLE) IMPLANT
SUCTION FRAZIER HANDLE 10FR (MISCELLANEOUS) ×1
SUCTION TUBE FRAZIER 10FR DISP (MISCELLANEOUS) ×1 IMPLANT
SUT ETHILON 3 0 PS 1 (SUTURE) IMPLANT
SUT ETHILON 4 0 PS 2 18 (SUTURE) IMPLANT
SUT MNCRL AB 4-0 PS2 18 (SUTURE) IMPLANT
SUT VIC AB 0 CT1 27 (SUTURE)
SUT VIC AB 0 CT1 27XBRD ANBCTR (SUTURE) IMPLANT
SUT VIC AB 2-0 SH 18 (SUTURE) IMPLANT
SUT VIC AB 2-0 SH 27 (SUTURE) ×1
SUT VIC AB 2-0 SH 27XBRD (SUTURE) ×1 IMPLANT
SUT VIC AB 3-0 SH 27 (SUTURE) ×1
SUT VIC AB 3-0 SH 27X BRD (SUTURE) ×1 IMPLANT
SUT VICRYL 3-0 CR8 SH (SUTURE) IMPLANT
SYR BULB EAR ULCER 3OZ GRN STR (SYRINGE) ×2 IMPLANT
SYR CONTROL 10ML LL (SYRINGE) IMPLANT
TOWEL GREEN STERILE FF (TOWEL DISPOSABLE) ×2 IMPLANT
TUBE CONNECTING 20X1/4 (TUBING) ×2 IMPLANT
UNDERPAD 30X36 HEAVY ABSORB (UNDERPADS AND DIAPERS) ×2 IMPLANT
YANKAUER SUCT BULB TIP NO VENT (SUCTIONS) ×2 IMPLANT

## 2020-12-01 NOTE — Anesthesia Preprocedure Evaluation (Signed)
Anesthesia Evaluation  Patient identified by MRN, date of birth, ID band Patient awake    Reviewed: Allergy & Precautions, NPO status , Patient's Chart, lab work & pertinent test results  Airway Mallampati: II  TM Distance: >3 FB Neck ROM: Full    Dental no notable dental hx.    Pulmonary neg pulmonary ROS, former smoker,    Pulmonary exam normal breath sounds clear to auscultation       Cardiovascular hypertension, Pt. on medications negative cardio ROS Normal cardiovascular exam Rhythm:Regular Rate:Normal     Neuro/Psych  Headaches, Depression negative psych ROS   GI/Hepatic negative GI ROS, Neg liver ROS,   Endo/Other  negative endocrine ROS  Renal/GU negative Renal ROS  negative genitourinary   Musculoskeletal negative musculoskeletal ROS (+)   Abdominal (+) + obese,   Peds negative pediatric ROS (+)  Hematology negative hematology ROS (+)   Anesthesia Other Findings   Reproductive/Obstetrics negative OB ROS                             Anesthesia Physical Anesthesia Plan  ASA: 2  Anesthesia Plan: General   Post-op Pain Management:  Regional for Post-op pain   Induction: Intravenous  PONV Risk Score and Plan: 3 and Ondansetron, Dexamethasone, Midazolam and Treatment may vary due to age or medical condition  Airway Management Planned: LMA  Additional Equipment:   Intra-op Plan:   Post-operative Plan: Extubation in OR  Informed Consent: I have reviewed the patients History and Physical, chart, labs and discussed the procedure including the risks, benefits and alternatives for the proposed anesthesia with the patient or authorized representative who has indicated his/her understanding and acceptance.     Dental advisory given  Plan Discussed with: CRNA  Anesthesia Plan Comments:         Anesthesia Quick Evaluation

## 2020-12-01 NOTE — Anesthesia Postprocedure Evaluation (Signed)
Anesthesia Post Note  Patient: Michelle Garcia  Procedure(s) Performed: OPEN REDUCTION INTERNAL FIXATION (ORIF) ANKLE FRACTURE (Right: Ankle)     Patient location during evaluation: PACU Anesthesia Type: General Level of consciousness: awake and alert Pain management: pain level controlled Vital Signs Assessment: post-procedure vital signs reviewed and stable Respiratory status: spontaneous breathing, nonlabored ventilation and respiratory function stable Cardiovascular status: blood pressure returned to baseline and stable Postop Assessment: no apparent nausea or vomiting Anesthetic complications: no   No notable events documented.  Last Vitals:  Vitals:   12/01/20 1356 12/01/20 1420  BP: 127/85   Pulse: (!) 127 (!) 123  Resp:    Temp: 37.2 C   SpO2: 98% 97%    Last Pain:  Vitals:   12/01/20 1356  TempSrc: Oral  PainSc:                  Lowella Curb

## 2020-12-01 NOTE — Transfer of Care (Signed)
Immediate Anesthesia Transfer of Care Note  Patient: Michelle Garcia  Procedure(s) Performed: OPEN REDUCTION INTERNAL FIXATION (ORIF) ANKLE FRACTURE (Right: Ankle)  Patient Location: PACU  Anesthesia Type:GA combined with regional for post-op pain  Level of Consciousness: awake  Airway & Oxygen Therapy: Patient Spontanous Breathing and Patient connected to face mask oxygen  Post-op Assessment: Report given to RN and Post -op Vital signs reviewed and stable  Post vital signs: Reviewed and stable  Last Vitals:  Vitals Value Taken Time  BP 142/73 12/01/20 1252  Temp    Pulse 118 12/01/20 1254  Resp 22 12/01/20 1254  SpO2 99 % 12/01/20 1254  Vitals shown include unvalidated device data.  Last Pain:  Vitals:   12/01/20 0918  TempSrc: Oral  PainSc: 6       Patients Stated Pain Goal: 3 (12/01/20 0037)  Complications: No notable events documented.

## 2020-12-01 NOTE — Anesthesia Procedure Notes (Signed)
Anesthesia Regional Block: Adductor canal block   Pre-Anesthetic Checklist: , timeout performed,  Correct Patient, Correct Site, Correct Laterality,  Correct Procedure, Correct Position, site marked,  Risks and benefits discussed,  Surgical consent,  Pre-op evaluation,  At surgeon's request and post-op pain management  Laterality: Right  Prep: chloraprep       Needles:  Injection technique: Single-shot  Needle Type: Stimiplex     Needle Length: 9cm  Needle Gauge: 21     Additional Needles:   Procedures:,,,, ultrasound used (permanent image in chart),,    Narrative:  Start time: 12/01/2020 10:02 AM End time: 12/01/2020 10:07 AM Injection made incrementally with aspirations every 5 mL.  Performed by: Personally  Anesthesiologist: Lowella Curb, MD

## 2020-12-01 NOTE — Op Note (Signed)
12/01/2020  PATIENT:  Michelle Garcia    PRE-OPERATIVE DIAGNOSIS: Right bimalleolar ankle fracture involving distal fibula and posterior malleolus  POST-OPERATIVE DIAGNOSIS:  Same  PROCEDURE: Open reduction internal fixation right bimalleolar ankle fracture including the fibula and posterior malleolus  SURGEON:  Eulas Post, MD  PHYSICIAN ASSISTANT: Janine Ores, PA-C, present and scrubbed throughout the case, critical for completion in a timely fashion, and for retraction, instrumentation, and closure.  ANESTHESIA:   General  ESTIMATED BLOOD LOSS: Minimal  PREOPERATIVE INDICATIONS:  Michelle Garcia is a  35 y.o. female with a diagnosis of right ankle fracture who elected for surgical management to minimize the risk for malunion and nonunion and post-traumatic arthritis.    The risks benefits and alternatives were discussed with the patient preoperatively including but not limited to the risks of infection, bleeding, nerve injury, cardiopulmonary complications, the need for revision surgery, the need for hardware removal, among others, and the patient was willing to proceed.  OPERATIVE IMPLANTS: Zimmer 1/3 tubular plate, with a single interfragmentary screw, and 1 4.0 mm cannulated screw for the posterior malleolus.  OPERATIVE PROCEDURE: The patient was brought to the operating room and placed in the supine position. All bony prominences were padded. General anesthesia was administered. The lower extremity was prepped and draped in the usual sterile fashion.  Tourniquet was utilized. Time out was performed.   Incision was made over the distal fibula and the fracture was exposed and reduced anatomically with a clamp. A interfragmentary screw was placed. I then applied a 1/3 tubular plate and secured it proximally and distally with non-locking screws. Bone quality was fair. I used c-arm to confirm satisfactory reduction and fixation.  The fracture wanted to spin slightly during the  course of fixation, and ultimately with the use of the clamp I was able to maintain the appropriate position during final fixation.  I then turned my attention to the posterior malleolus.  I marked my skin alignment, and then made an incision just medial to the tibialis anterior.  The best line for the fixation of the relatively small posterior fragment was from anteromedial to posterolateral.  I dissected bluntly down along the tibialis anterior, swept the tendon out of the way, exposed the periosteum, and then placed the protective jig.  I placed a guidewire from anteromedial to the posterolateral corner, confirmed on multiple views of fluoroscopy, measured the length, and placed the screw after reaming through both cortices.  Excellent fixation was achieved.  I evaluated the posterior malleolus under live fluoroscopy and it was found to be anatomic and stable.    The syndesmosis was stressed using live fluoroscopy and found to be stable.   The wounds were irrigated, and closed with vicryl with routine closure for the skin. Sterile gauze was applied followed by a posterior splint. She was awakened and returned to the PACU in stable and satisfactory condition. There were no complications.

## 2020-12-01 NOTE — Anesthesia Procedure Notes (Signed)
Procedure Name: LMA Insertion Date/Time: 12/01/2020 11:24 AM Performed by: Lance Coon, CRNA Pre-anesthesia Checklist: Patient identified, Emergency Drugs available, Suction available and Patient being monitored Patient Re-evaluated:Patient Re-evaluated prior to induction Oxygen Delivery Method: Circle system utilized Preoxygenation: Pre-oxygenation with 100% oxygen Induction Type: IV induction Ventilation: Mask ventilation without difficulty LMA: LMA inserted LMA Size: 3.0 Number of attempts: 1 Airway Equipment and Method: Bite block Placement Confirmation: positive ETCO2 Tube secured with: Tape Dental Injury: Teeth and Oropharynx as per pre-operative assessment

## 2020-12-01 NOTE — Discharge Instructions (Addendum)
Post Anesthesia Home Care Instructions  Activity: Get plenty of rest for the remainder of the day. A responsible individual must stay with you for 24 hours following the procedure.  For the next 24 hours, DO NOT: -Drive a car -Advertising copywriter -Drink alcoholic beverages -Take any medication unless instructed by your physician -Make any legal decisions or sign important papers.  Meals: Start with liquid foods such as gelatin or soup. Progress to regular foods as tolerated. Avoid greasy, spicy, heavy foods. If nausea and/or vomiting occur, drink only clear liquids until the nausea and/or vomiting subsides. Call your physician if vomiting continues.  Special Instructions/Symptoms: Your throat may feel dry or sore from the anesthesia or the breathing tube placed in your throat during surgery. If this causes discomfort, gargle with warm salt water. The discomfort should disappear within 24 hours.  If you had a scopolamine patch placed behind your ear for the management of post- operative nausea and/or vomiting:  1. The medication in the patch is effective for 72 hours, after which it should be removed.  Wrap patch in a tissue and discard in the trash. Wash hands thoroughly with soap and water. 2. You may remove the patch earlier than 72 hours if you experience unpleasant side effects which may include dry mouth, dizziness or visual disturbances. 3. Avoid touching the patch. Wash your hands with soap and water after contact with the patch.  Regional Anesthesia Blocks  1. Numbness or the inability to move the "blocked" extremity may last from 3-48 hours after placement. The length of time depends on the medication injected and your individual response to the medication. If the numbness is not going away after 48 hours, call your surgeon.  2. The extremity that is blocked will need to be protected until the numbness is gone and the  Strength has returned. Because you cannot feel it, you will  need to take extra care to avoid injury. Because it may be weak, you may have difficulty moving it or using it. You may not know what position it is in without looking at it while the block is in effect.  3. For blocks in the legs and feet, returning to weight bearing and walking needs to be done carefully. You will need to wait until the numbness is entirely gone and the strength has returned. You should be able to move your leg and foot normally before you try and bear weight or walk. You will need someone to be with you when you first try to ensure you do not fall and possibly risk injury.  4. Bruising and tenderness at the needle site are common side effects and will resolve in a few days.  5. Persistent numbness or new problems with movement should be communicated to the surgeon or the [facility] [phone] [facility] [phone]).       Diet: As you were doing prior to hospitalization   Shower:  May shower but keep the wounds dry, use an occlusive plastic wrap, NO SOAKING IN TUB.  If the bandage gets wet, change with a clean dry gauze.  If you have a splint on, leave the splint in place and keep the splint dry with a plastic bag.  Dressing:  You may change your dressing 3-5 days after surgery, unless you have a splint.  If you have a splint, then just leave the splint in place and we will change your bandages during your first follow-up appointment.  If you had hand or foot surgery, we will plan to remove your stitches in about 2 weeks in the office.  For all other surgeries, there are sticky tapes (steri-strips) on your wounds and all the stitches are absorbable.  Leave the steri-strips in place when changing your dressings, they will peel off with time, usually 2-3 weeks.  Activity:  Increase activity slowly as tolerated, but follow the weight bearing instructions below.  The rules on driving is that you can not be taking narcotics while you drive, and you  must feel in control of the vehicle.    Weight Bearing:   No weight bearing operative leg.   To prevent constipation: you may use a stool softener such as -  Colace (over the counter) 100 mg by mouth twice a day  Drink plenty of fluids (prune juice may be helpful) and high fiber foods Miralax (over the counter) for constipation as needed.    Itching:  If you experience itching with your medications, try taking only a single pain pill, or even half a pain pill at a time.  You may take up to 10 pain pills per day, and you can also use benadryl over the counter for itching or also to help with sleep.   Precautions:  If you experience chest pain or shortness of breath - call 911 immediately for transfer to the hospital emergency department!!  If you develop a fever greater that 101 F, purulent drainage from wound, increased redness or drainage from wound, or calf pain -- Call the office at [phone]                                                Follow- Up Appointment:  Please call for an appointment to be seen in 2 weeks [place] - [phone]

## 2020-12-01 NOTE — Interval H&P Note (Signed)
History and Physical Interval Note:  12/01/2020 10:51 AM  Michelle Garcia  has presented today for surgery, with the diagnosis of right ankle fracture.  The various methods of treatment have been discussed with the patient and family. After consideration of risks, benefits and other options for treatment, the patient has consented to  Procedure(s): OPEN REDUCTION INTERNAL FIXATION (ORIF) ANKLE FRACTURE (Right) as a surgical intervention.  The patient's history has been reviewed, patient examined, no change in status, stable for surgery.  I have reviewed the patient's chart and labs.  Questions were answered to the patient's satisfaction.     Eulas Post

## 2020-12-01 NOTE — Progress Notes (Signed)
Assisted Dr. Hyacinth Meeker with right, popliteal, adductor canal block. Side rails up, monitors on throughout procedure. See vital signs in flow sheet. Tolerated Procedure well.

## 2020-12-01 NOTE — Anesthesia Procedure Notes (Signed)
Anesthesia Regional Block: Popliteal block   Pre-Anesthetic Checklist: , timeout performed,  Correct Patient, Correct Site, Correct Laterality,  Correct Procedure, Correct Position, site marked,  Risks and benefits discussed,  Surgical consent,  Pre-op evaluation,  At surgeon's request and post-op pain management  Laterality: Right  Prep: chloraprep       Needles:  Injection technique: Single-shot  Needle Type: Stimiplex     Needle Length: 9cm  Needle Gauge: 21     Additional Needles:   Procedures:,,,, ultrasound used (permanent image in chart),,    Narrative:  Start time: 12/01/2020 10:01 AM End time: 12/01/2020 10:06 AM Injection made incrementally with aspirations every 5 mL.  Performed by: Personally  Anesthesiologist: Lowella Curb, MD

## 2020-12-03 ENCOUNTER — Encounter (HOSPITAL_BASED_OUTPATIENT_CLINIC_OR_DEPARTMENT_OTHER): Payer: Self-pay | Admitting: Orthopedic Surgery

## 2020-12-05 ENCOUNTER — Other Ambulatory Visit: Payer: Self-pay

## 2022-03-15 IMAGING — CT CT ANKLE*R* W/O CM
3 series · 15 of 33 positions shown, 18 images · non-contrast
Comparison: Radiographs 11/21/2020

CLINICAL DATA: Preop for ankle fracture fixation.

EXAM:
CT OF THE RIGHT ANKLE WITHOUT CONTRAST
TECHNIQUE: Multidetector CT imaging of the right ankle was performed according
to the standard protocol. Multiplanar CT image reconstructions were
also generated.

[Series 5: sfov lower extremity 2.00 br40 s3 soft · axial · 0.24mm/px · z∈[+519,+643]mm · 7 of 74 slices shown, 9 images (1 of 3)]
[im 6/74  soft-tissue]
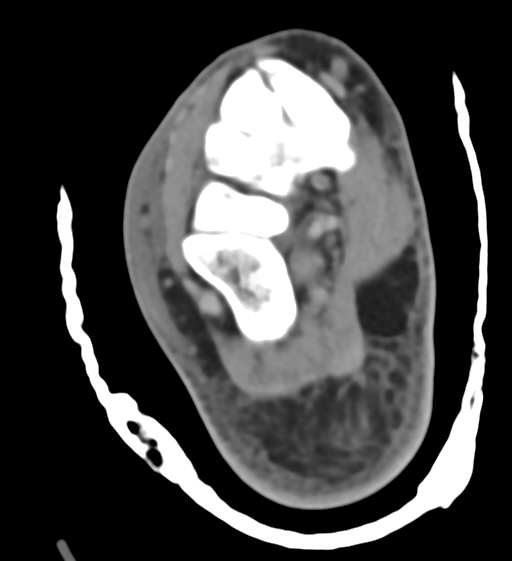
[im 6/74  bone]
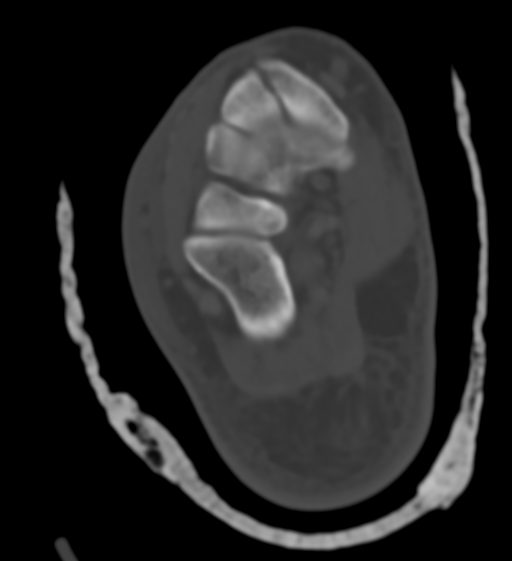
[im 17/74  bone]
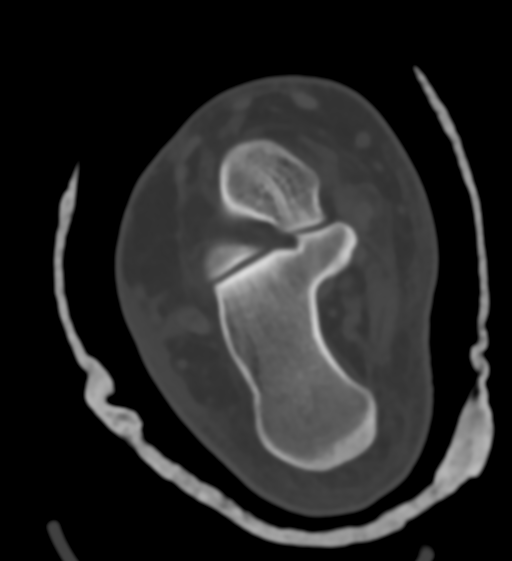
[im 29/74  bone]
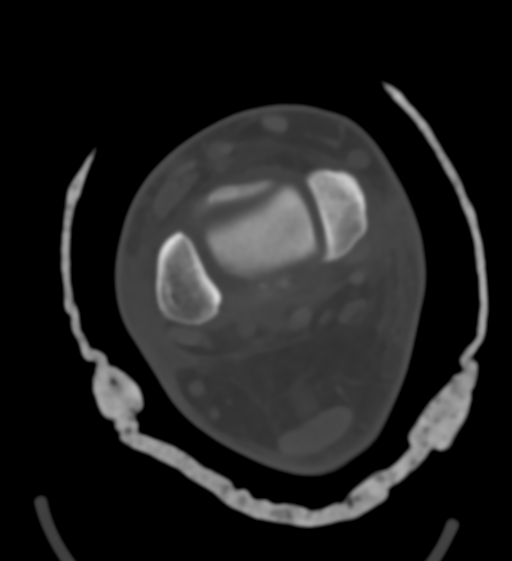
[im 40/74  bone]
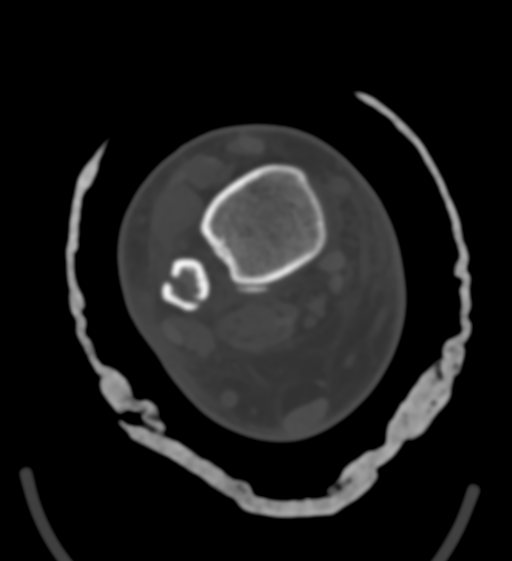
[im 45/74  soft-tissue]
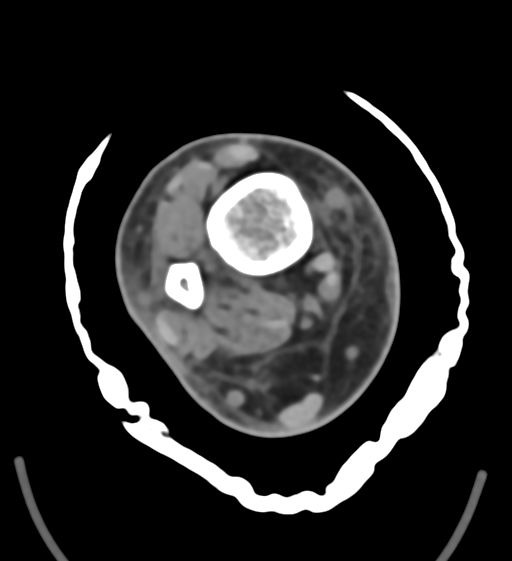
[im 45/74  bone]
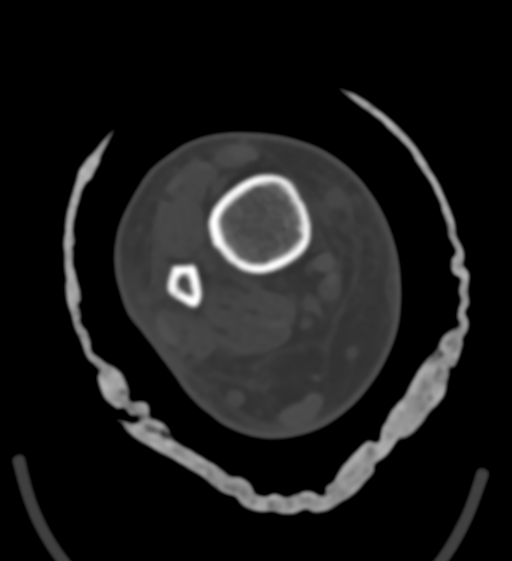
[im 57/74  bone]
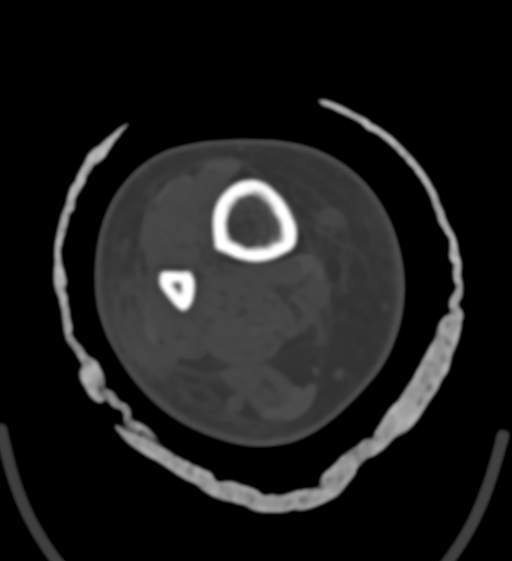
[im 68/74  bone]
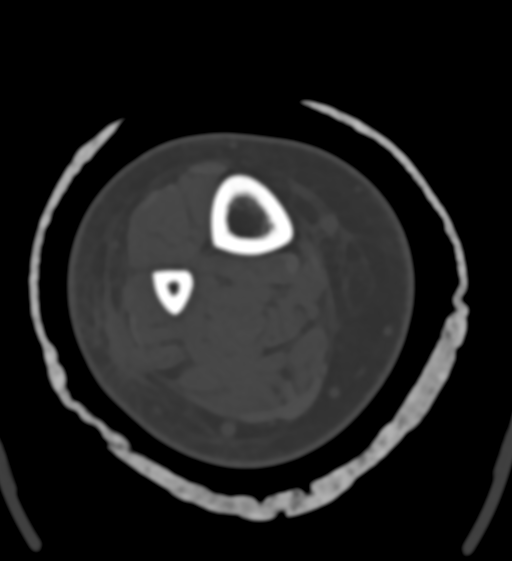

[Series 9: sfov lower extremity 2.00 br40 s3 soft · coronal · 0.24mm/px · 3 of 72 slices shown (2 of 3)]
[im 15/72  bone]
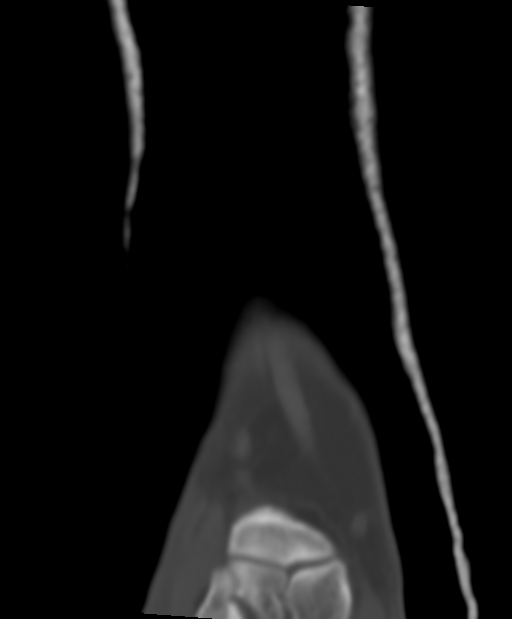
[im 29/72  bone]
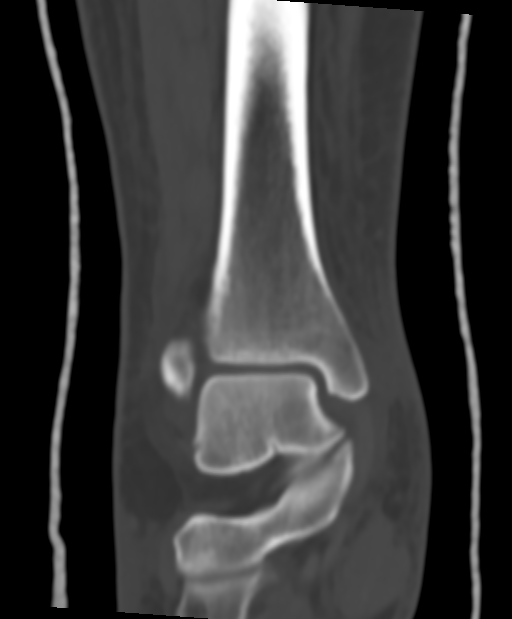
[im 43/72  bone]
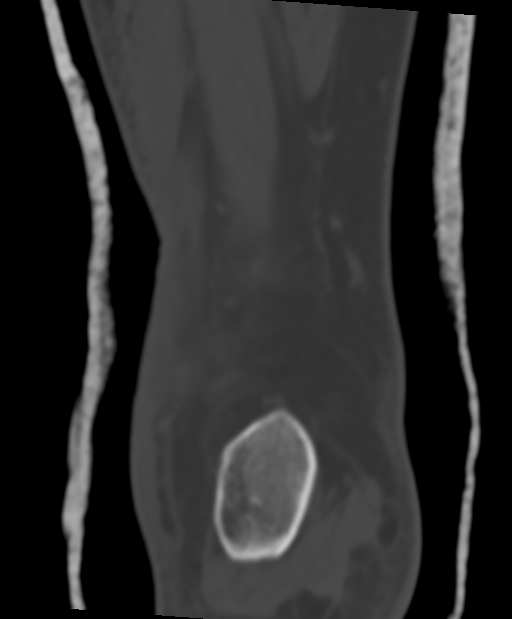

[Series 13: sfov lower extremity 2.00 br40 s3 soft · sagittal · 0.28mm/px · 5 of 61 slices shown, 6 images (3 of 3)]
[im 21/61  bone]
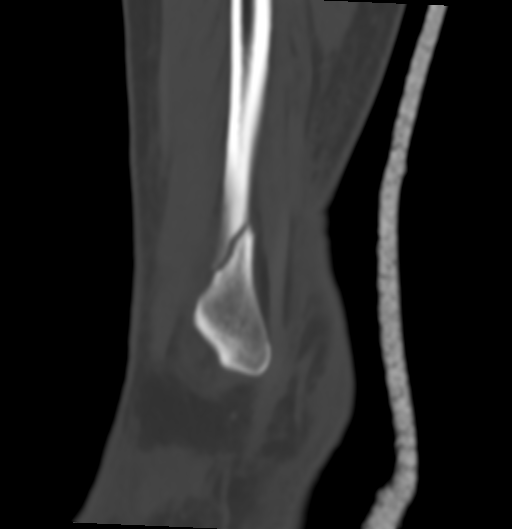
[im 26/61  bone]
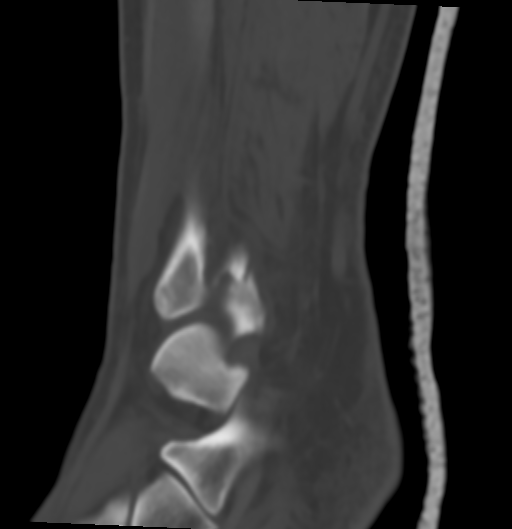
[im 31/61  soft-tissue]
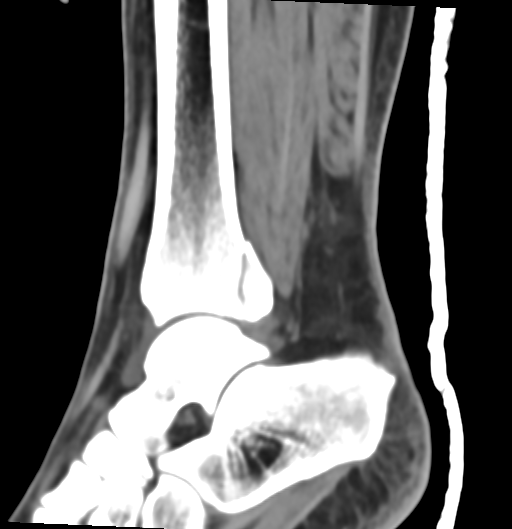
[im 31/61  bone]
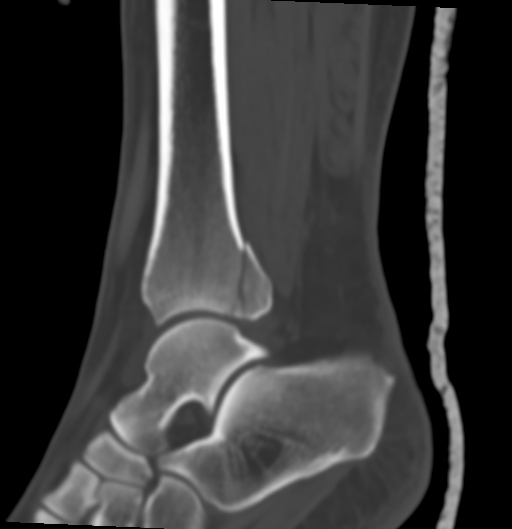
[im 36/61  bone]
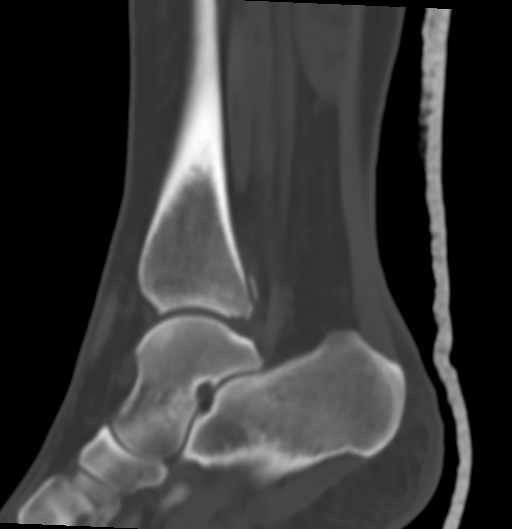
[im 41/61  bone]
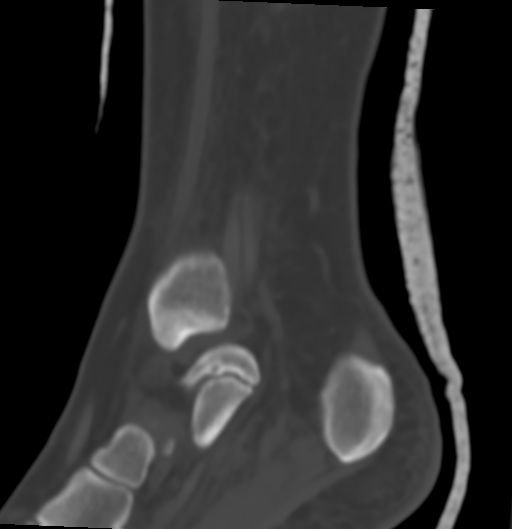

[15 of 33 positions shown; findings below may reference images not displayed]

FINDINGS: There is a nondisplaced oblique coursing fracture of the distal
fibular shaft above the level of the ankle mortise.

Nondisplaced mildly comminuted intra-articular fracture involving
the posterior malleolus with a few tiny fracture fragments in the
joint space. No fracture of the medial malleolus.

The ankle mortise is maintained. The subtalar joints are maintained.
No hindfoot fractures are identified. Sinus tarsi is normal.

Grossly by CT the major ankle tendons and ligaments are intact. Type
1 os navicular noted.
IMPRESSION: 1. Nondisplaced oblique coursing fracture of the distal fibular
shaft above the level of the ankle mortise.
2. Nondisplaced mildly comminuted intra-articular fracture involving
the posterior malleolus with a few tiny fracture fragments in the
joint space.
3. No hindfoot fractures are identified.
4. Grossly by CT the major ankle tendons and ligaments are intact.
